# Patient Record
Sex: Female | Born: 1990 | Race: White | Hispanic: No | Marital: Single | Smoking: Former smoker
Health system: Southern US, Community
[De-identification: ages and names within clinical notes are randomized; demographics above are authoritative.]

---
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
## 2009-03-22 HISTORY — PX: MOUTH SURGERY: SHX715

## 2010-08-04 NOTE — Letter (Signed)
May 20, 2008    Frazier Butt, DO  [address]  [address]  [place], [place] [address]   RE:  Leah, Nguyen  MRN:  [id number]  /  DOB:  May 13, 1990   Dear Dr. Margaretha Sheffield:   It was my pleasure to see Leah Nguyen in electrophysiology followup  today.  As you recall, she is a pleasant 20 year old female with  episodic heart racing.  She describes abrupt onset and gradual  termination of heart racing.  Upon previously being evaluated by me in  January 2010, she reported that episodes occurred several times per  month.  We therefore placed a life watch event monitor between April 25, 2008, and May 15, 2008.  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  We therefore only  recorded approximately 8 days of rhythms.  She had no arrhythmias  detected during that time.  The patient denies any symptomatic episodes  of tachycardia since last being seen in our office.  She is otherwise  without complaint today.   PROBLEM LIST:  Palpitations (as above).   CURRENT MEDICATIONS:  1. Septra b.i.d.  2. Birth control shot monthly   PHYSICAL EXAMINATION:  VITAL SIGNS:  Blood pressure 126/56, respirations  18, pulse 76 beats per minute.  GENERAL:  The patient is a well-appearing female in no acute distress.  She is alert and oriented x3.  HEENT:  Normocephalic, atraumatic.  Sclerae are clear.  Conjunctivae are  pink.  Oropharynx is clear.  NECK:  Supple.  No thyromegaly, JVD, or bruits.  LUNGS:  Clear to auscultation bilaterally.  HEART:  Regular rate and rhythm.  No murmurs, rubs, or gallops.  GI:  Soft, nontender, nondistended with positive bowel sounds.  EXTREMITIES:  No clubbing, cyanosis, or edema.  NEUROLOGIC:  Cranial nerves II-XII are intact.  Strength and sensation  are intact.  SKIN:  No ecchymoses or lacerations.   IMPRESSION:  Leah Nguyen is a pleasant  20 year old female with prior  symptomatic heart racing with abrupt onset and gradual termination.  Her symptoms are suggestive of supraventricular tachycardia; however, we  have been unable to document this previously.  A 21-day life watch  monitor failed to document any arrhythmias, though the patient is clear  that she did not have any symptomatic supraventricular tachycardia  during that time.  As her episodes are relatively infrequent, I do not  think that she would benefit from daily medicines, though for episodes  increased in frequency, we could consider diltiazem down the road.  I  think that presently we should continue our strategy of observation, so  I think that we should continue a strategy of observation.  Should she  have increasing frequency and duration of her symptoms then hopefully we  will be able to document her arrhythmia.  Therefore, if episodes  continue to worsen, we could also consider EP study, though it would be  ideal to have tachycardia documented prior to EP study.  I have asked  her to return to clinic in 3 months for further assessment.   PLAN:  1. No medication changes today.  2. Follow up in my office in 3 months.   Thank you for the opportunity of participating in the care of Ms.  Nguyen.  Please feel free to contact me if you wish to discuss her care  further.    Sincerely,  Hillis Range, MD  Electronically Signed    JA/MedQ  DD: 05/20/2008  DT: 05/21/2008  Job #: [id number]

## 2010-08-04 NOTE — Letter (Signed)
April 18, 2008    Marsa Aris, DO  [address]  [place], [place] [address]   RE:  [name], [name]  MRN:  [id number]  /  DOB:  [birth year]   Dear Dr. Margaretha Sheffield:   It was my pleasure to see your patient, Leah Nguyen in  electrophysiology consultation today regarding symptomatic heart  racing.  She is a very pleasant 20 year old female without significant  past medical history except for episodic heart racing.  She reports that  since the 5th grade, she has had episodes of abrupt onset and gradual  termination of tachycardia.  These episodes typically occur with sudden  jerking movements including bending or throwing.  She reports associated  palpitations, weakness, dizziness, and shortness of breath.  She denies  presyncope or syncope.  These episodes typically last between 5 and 30  minutes.  Unfortunately, they have not persisted long enough to have an  EKG obtained.  We have therefore, not previously documented her  tachycardia.  She notes that episodes have increased in frequency and  now occur approximately one time per month.  She has recently tried  vagal maneuvers without success.  She otherwise remains quite active.  She exercises approximately 2 hours per day including heavy weight  lifting and running.  She denies chest pain, presyncope, or syncope.  She reports that she feels fine following episodes of tachycardia  without residual symptoms.  She is otherwise without complaint today.   PAST MEDICAL HISTORY:  Symptomatic heart racing likely secondary to  supraventricular tachycardia, though this has not been documented.   PAST SURGICAL HISTORY:  None.   ALLERGIES:  None.   CURRENT MEDICATIONS:  1. Septra b.i.d. for acne.  2. Birth control shot monthly.   SOCIAL HISTORY:  The patient lives in [place].  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  She has interest in nursing.  She plays softball for [employer].   She denies tobacco, alcohol,  or drug use.   FAMILY HISTORY:  She denies any family history of cardiomyopathy, sudden  cardiac death, or arrhythmia.   REVIEW OF SYSTEMS:  All systems were reviewed and negative except as  outlined in the HPI above.   PHYSICAL EXAMINATION:  VITALS:  Blood pressure 110/58, heart rate 60,  respirations 18, weight 189 pounds.  GENERAL:  The patient is a well-appearing female, in no acute distress.  She is alert and oriented x3.  HEENT:  Normocephalic, atraumatic.  Sclerae clear.  Conjunctivae pink.  Oropharynx clear.  NECK:  Supple.  No thyromegaly, JVD, or bruits.  LUNGS:  Clear to auscultation bilaterally.  HEART:  Regular rate and rhythm.  No murmurs, rubs, or gallops.  GI:  Soft, nontender, nondistended.  Positive bowel sounds.  EXTREMITIES:  No clubbing, cyanosis, or edema.  NEUROLOGIC:  Cranial nerves II through XII are intact.  Strength and  sensation are intact.  SKIN:  No ecchymosis or lacerations.  MUSCULOSKELETAL:  No deformity or atrophy.  PSYCH:  Euthymic mood, full affect.   EKG reveals sinus rhythm at 70 beats per minute and is otherwise  unremarkable.  The PR interval measures 140 milliseconds with a QT  interval of 402 milliseconds.   IMPRESSION:  Leah Nguyen is a pleasant 20 year old female without  significant past medical history who presents for further evaluation and  management of symptomatic palpitations.  Her history is consistent with  supraventricular tachycardia that we have not been able to  previously  document this.  I think that it would be important to attempt to  document her arrhythmia.  I would therefore recommend a LifeWatch 21-day  event monitor.  Once we have documented her tachycardia, then we can  consider treatment options including medical therapy or catheter  ablation.  If we are unable to document her tachycardia, then I think  that an echocardiogram would be reasonable to evaluate for structural  heart  disease.   PLAN:  Therapeutic strategies for symptomatic heart racing was  discussed in detail with the patient today including both medicine and  catheter-based therapies.  Risks, benefits, and alternatives to EP study  and radiofrequency ablation for tachycardia was also discussed.  We will  have a LifeWatch monitor placed and then have the patient return to  clinic in 1 month for further discussion.  As she has had longstanding  palpitations without presyncope or syncope, I think that it is  reasonable for her to continue her present activity level without  placing any new restrictions.   Thank you for the opportunity of participating in the care of Ms.  Nguyen.  Please feel free to contact me if you wish to discuss her care  further.    Sincerely,      Hillis Range, MD  Electronically Signed    JA/MedQ  DD: 04/18/2008  DT: 04/19/2008  Job #: [id number]

## 2015-02-06 ENCOUNTER — Emergency Department (HOSPITAL_COMMUNITY): Payer: No Typology Code available for payment source

## 2015-02-06 ENCOUNTER — Emergency Department (HOSPITAL_COMMUNITY)
Admission: EM | Admit: 2015-02-06 | Discharge: 2015-02-06 | Disposition: A | Discharge status: Home / Self Care | Payer: No Typology Code available for payment source | Attending: Emergency Medicine | Admitting: Emergency Medicine

## 2015-02-06 ENCOUNTER — Encounter (HOSPITAL_COMMUNITY): Payer: Self-pay

## 2015-02-06 DIAGNOSIS — R059 Cough, unspecified: Secondary | ICD-10-CM

## 2015-02-06 DIAGNOSIS — H9202 Otalgia, left ear: Secondary | ICD-10-CM | POA: Diagnosis present

## 2015-02-06 DIAGNOSIS — H6092 Unspecified otitis externa, left ear: Secondary | ICD-10-CM | POA: Insufficient documentation

## 2015-02-06 DIAGNOSIS — J069 Acute upper respiratory infection, unspecified: Secondary | ICD-10-CM | POA: Insufficient documentation

## 2015-02-06 DIAGNOSIS — R05 Cough: Secondary | ICD-10-CM

## 2015-02-06 DIAGNOSIS — F17200 Nicotine dependence, unspecified, uncomplicated: Secondary | ICD-10-CM | POA: Diagnosis not present

## 2015-02-06 DIAGNOSIS — Z720 Tobacco use: Secondary | ICD-10-CM

## 2015-02-06 MED ORDER — NEOMYCIN-POLYMYXIN-HC 3.5-10000-1 OT SUSP: 4.0000 [drp] | Freq: Three times a day (TID) | OTIC | Status: DC

## 2015-02-06 NOTE — ED Notes (Signed)
PT st's she has had green drainage from left ear for approx 1 month but does not hurt.  St's at times ear feels full and tender to touch
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
## 2015-02-06 NOTE — ED Notes (Signed)
Pt here for left ear pain, onset one month ago with green drainage from ear and she reports sometimes its hard to hear out of the affected ear.  Pt came in tonight because "I'm tired of messing with it." Pt also reports cough of light green phlegm. Denies sore throat.

## 2015-02-06 NOTE — Discharge Instructions (Signed)
Use ear drops as instructed. DO NOT put anything else into your ear. Continue to stay well-hydrated. Continue to alternate between Tylenol and Ibuprofen for pain or fever. Use Mucinex for cough suppression/expectoration of mucus. Use netipot and flonase to help with nasal congestion. May consider over-the-counter Benadryl or other antihistamine to decrease secretions and for watery itchy eyes. STOP SMOKING! Followup with your primary care doctor in 5-7 days for recheck of ongoing symptoms. Return to emergency department for emergent changing or worsening of symptoms.   Otitis Externa Otitis externa is a germ infection in the outer ear. The outer ear is the area from the eardrum to the outside of the ear. Otitis externa is sometimes called "swimmer's ear." HOME CARE  Put drops in the ear as told by your doctor.  Only take medicine as told by your doctor.  If you have diabetes, your doctor may give you more directions. Follow your doctor's directions.  Keep all doctor visits as told. To avoid another infection:  Keep your ear dry. Use the corner of a towel to dry your ear after swimming or bathing.  Avoid scratching or putting things inside your ear.  Avoid swimming in lakes, dirty water, or pools that use a chemical called chlorine poorly.  You may use ear drops after swimming. Combine equal amounts of white vinegar and alcohol in a bottle. Put 3 or 4 drops in each ear. GET HELP IF:   You have a fever.  Your ear is still red, puffy (swollen), or painful after 3 days.  You still have yellowish-white fluid (pus) coming from the ear after 3 days.  Your redness, puffiness, or pain gets worse.  You have a really bad headache.  You have redness, puffiness, pain, or tenderness behind your ear. MAKE SURE YOU:   Understand these instructions.  Will watch your condition.  Will get help right away if you are not doing well or get worse.   This information is not intended to replace  advice given to you by your health care provider. Make sure you discuss any questions you have with your health care provider.   Document Released: 08/25/2007 Document Revised: 03/29/2014 Document Reviewed: 03/25/2011 Elsevier Interactive Patient Education 2016 Elsevier Inc.  Cough, Adult A cough helps to clear your throat and lungs. A cough may last only 2-3 weeks (acute), or it may last longer than 8 weeks (chronic). Many different things can cause a cough. A cough may be a sign of an illness or another medical condition. HOME CARE  Pay attention to any changes in your cough.  Take medicines only as told by your doctor.  If you were prescribed an antibiotic medicine, take it as told by your doctor. Do not stop taking it even if you start to feel better.  Talk with your doctor before you try using a cough medicine.  Drink enough fluid to keep your pee (urine) clear or pale yellow.  If the air is dry, use a cold steam vaporizer or humidifier in your home.  Stay away from things that make you cough at work or at home.  If your cough is worse at night, try using extra pillows to raise your head up higher while you sleep.  Do not smoke, and try not to be around smoke. If you need help quitting, ask your doctor.  Do not have caffeine.  Do not drink alcohol.  Rest as needed. GET HELP IF:  You have new problems (symptoms).  You cough up  yellow fluid (pus).  Your cough does not get better after 2-3 weeks, or your cough gets worse.  Medicine does not help your cough and you are not sleeping well.  You have pain that gets worse or pain that is not helped with medicine.  You have a fever.  You are losing weight and you do not know why.  You have night sweats. GET HELP RIGHT AWAY IF:  You cough up blood.  You have trouble breathing.  Your heartbeat is very fast.   This information is not intended to replace advice given to you by your health care provider. Make sure you  discuss any questions you have with your health care provider.   Document Released: 11/19/2010 Document Revised: 11/27/2014 Document Reviewed: 05/15/2014 Elsevier Interactive Patient Education [employer] Inc.  Allergies An allergy is when your body reacts to a substance in a way that is not normal. An allergic reaction can happen after you:  Eat something.  Breathe in something.  Touch something. WHAT KINDS OF ALLERGIES ARE THERE? You can be allergic to:  Things that are only around during certain seasons, like molds and pollens.  Foods.  Drugs.  Insects.  Animal dander. WHAT ARE SYMPTOMS OF ALLERGIES?  Puffiness (swelling). This may happen on the lips, face, tongue, mouth, or throat.  Sneezing.  Coughing.  Breathing loudly (wheezing).  Stuffy nose.  Tingling in the mouth.  A rash.  Itching.  Itchy, red, puffy areas of skin (hives).  Watery eyes.  Throwing up (vomiting).  Watery poop (diarrhea).  Dizziness.  Feeling faint or fainting.  Trouble breathing or swallowing.  A tight feeling in the chest.  A fast heartbeat. HOW ARE ALLERGIES DIAGNOSED? Allergies can be diagnosed with:  A medical and family history.  Skin tests.  Blood tests.  A food diary. A food diary is a record of all the foods, drinks, and symptoms you have each day.  The results of an elimination diet. This diet involves making sure not to eat certain foods and then seeing what happens when you start eating them again. HOW ARE ALLERGIES TREATED? There is no cure for allergies, but allergic reactions can be treated with medicine. Severe reactions usually need to be treated at a hospital.  HOW CAN REACTIONS BE PREVENTED? The best way to prevent an allergic reaction is to avoid the thing you are allergic to. Allergy shots and medicines can also help prevent reactions in some cases.   This information is not intended to replace advice given to you by your health care  provider. Make sure you discuss any questions you have with your health care provider.   Document Released: 07/03/2012 Document Revised: 03/29/2014 Document Reviewed: 12/18/2013 Elsevier Interactive Patient Education 2016 Elsevier Inc.  Viral Infections A viral infection can be caused by different types of viruses.Most viral infections are not serious and resolve on their own. However, some infections may cause severe symptoms and may lead to further complications. SYMPTOMS Viruses can frequently cause:  Minor sore throat.  Aches and pains.  Headaches.  Runny nose.  Different types of rashes.  Watery eyes.  Tiredness.  Cough.  Loss of appetite.  Gastrointestinal infections, resulting in nausea, vomiting, and diarrhea. These symptoms do not respond to antibiotics because the infection is not caused by bacteria. However, you might catch a bacterial infection following the viral infection. This is sometimes called a "superinfection." Symptoms of such a bacterial infection may include:  Worsening sore throat with pus and difficulty  swallowing.  Swollen neck glands.  Chills and a high or persistent fever.  Severe headache.  Tenderness over the sinuses.  Persistent overall ill feeling (malaise), muscle aches, and tiredness (fatigue).  Persistent cough.  Yellow, green, or brown mucus production with coughing. HOME CARE INSTRUCTIONS   Only take over-the-counter or prescription medicines for pain, discomfort, diarrhea, or fever as directed by your caregiver.  Drink enough water and fluids to keep your urine clear or pale yellow. Sports drinks can provide valuable electrolytes, sugars, and hydration.  Get plenty of rest and maintain proper nutrition. Soups and broths with crackers or rice are fine. SEEK IMMEDIATE MEDICAL CARE IF:   You have severe headaches, shortness of breath, chest pain, neck pain, or an unusual rash.  You have uncontrolled vomiting, diarrhea, or  you are unable to keep down fluids.  You or your child has an oral temperature above 102 F (38.9 C), not controlled by medicine.  Your baby is older than 3 months with a rectal temperature of 102 F (38.9 C) or higher.  Your baby is 64 months old or younger with a rectal temperature of 100.4 F (38 C) or higher. MAKE SURE YOU:   Understand these instructions.  Will watch your condition.  Will get help right away if you are not doing well or get worse.   This information is not intended to replace advice given to you by your health care provider. Make sure you discuss any questions you have with your health care provider.   Document Released: 12/16/2004 Document Revised: 05/31/2011 Document Reviewed: 08/14/2014 Elsevier Interactive Patient Education 2016 [employer].  Smoking Cessation, Tips for Success If you are ready to quit smoking, congratulations! You have chosen to help yourself be healthier. Cigarettes bring nicotine, tar, carbon monoxide, and other irritants into your body. Your lungs, heart, and blood vessels will be able to work better without these poisons. There are many different ways to quit smoking. Nicotine gum, nicotine patches, a nicotine inhaler, or nicotine nasal spray can help with physical craving. Hypnosis, support groups, and medicines help break the habit of smoking. WHAT THINGS CAN I DO TO MAKE QUITTING EASIER?  Here are some tips to help you quit for good:  Pick a date when you will quit smoking completely. Tell all of your friends and family about your plan to quit on that date.  Do not try to slowly cut down on the number of cigarettes you are smoking. Pick a quit date and quit smoking completely starting on that day.  Throw away all cigarettes.   Clean and remove all ashtrays from your home, work, and car.  On a card, write down your reasons for quitting. Carry the card with you and read it when you get the urge to smoke.  Cleanse your body of  nicotine. Drink enough water and fluids to keep your urine clear or pale yellow. Do this after quitting to flush the nicotine from your body.  Learn to predict your moods. Do not let a bad situation be your excuse to have a cigarette. Some situations in your life might tempt you into wanting a cigarette.  Never have "just one" cigarette. It leads to wanting another and another. Remind yourself of your decision to quit.  Change habits associated with smoking. If you smoked while driving or when feeling stressed, try other activities to replace smoking. Stand up when drinking your coffee. Brush your teeth after eating. Sit in a different chair when you read  the paper. Avoid alcohol while trying to quit, and try to drink fewer caffeinated beverages. Alcohol and caffeine may urge you to smoke.  Avoid foods and drinks that can trigger a desire to smoke, such as sugary or spicy foods and alcohol.  Ask people who smoke not to smoke around you.  Have something planned to do right after eating or having a cup of coffee. For example, plan to take a walk or exercise.  Try a relaxation exercise to calm you down and decrease your stress. Remember, you may be tense and nervous for the first 2 weeks after you quit, but this will pass.  Find new activities to keep your hands busy. Play with a pen, coin, or rubber band. Doodle or draw things on paper.  Brush your teeth right after eating. This will help cut down on the craving for the taste of tobacco after meals. You can also try mouthwash.   Use oral substitutes in place of cigarettes. Try using lemon drops, carrots, cinnamon sticks, or chewing gum. Keep them handy so they are available when you have the urge to smoke.  When you have the urge to smoke, try deep breathing.  Designate your home as a nonsmoking area.  If you are a heavy smoker, ask your health care provider about a prescription for nicotine chewing gum. It can ease your withdrawal from  nicotine.  Reward yourself. Set aside the cigarette money you save and buy yourself something nice.  Look for support from others. Join a support group or smoking cessation program. Ask someone at home or at work to help you with your plan to quit smoking.  Always ask yourself, "Do I need this cigarette or is this just a reflex?" Tell yourself, "Today, I choose not to smoke," or "I do not want to smoke." You are reminding yourself of your decision to quit.  Do not replace cigarette smoking with electronic cigarettes (commonly called e-cigarettes). The safety of e-cigarettes is unknown, and some may contain harmful chemicals.  If you relapse, do not give up! Plan ahead and think about what you will do the next time you get the urge to smoke. HOW WILL I FEEL WHEN I QUIT SMOKING? You may have symptoms of withdrawal because your body is used to nicotine (the addictive substance in cigarettes). You may crave cigarettes, be irritable, feel very hungry, cough often, get headaches, or have difficulty concentrating. The withdrawal symptoms are only temporary. They are strongest when you first quit but will go away within 10-14 days. When withdrawal symptoms occur, stay in control. Think about your reasons for quitting. Remind yourself that these are signs that your body is healing and getting used to being without cigarettes. Remember that withdrawal symptoms are easier to treat than the major diseases that smoking can cause.  Even after the withdrawal is over, expect periodic urges to smoke. However, these cravings are generally short lived and will go away whether you smoke or not. Do not smoke! WHAT RESOURCES ARE AVAILABLE TO HELP ME QUIT SMOKING? Your health care provider can direct you to community resources or hospitals for support, which may include:  Group support.  Education.  Hypnosis.  Therapy.   This information is not intended to replace advice given to you by your health care provider.  Make sure you discuss any questions you have with your health care provider.   Document Released: 12/05/2003 Document Revised: 03/29/2014 Document Reviewed: 08/24/2012 Elsevier Interactive Patient Education [employer]

## 2015-04-14 ENCOUNTER — Emergency Department (HOSPITAL_COMMUNITY)
Admission: EM | Admit: 2015-04-14 | Discharge: 2015-04-14 | Disposition: A | Discharge status: Home / Self Care | Payer: BLUE CROSS/BLUE SHIELD | Attending: Emergency Medicine | Admitting: Emergency Medicine

## 2015-04-14 DIAGNOSIS — Y9389 Activity, other specified: Secondary | ICD-10-CM | POA: Insufficient documentation

## 2015-04-14 DIAGNOSIS — Y998 Other external cause status: Secondary | ICD-10-CM | POA: Insufficient documentation

## 2015-04-14 DIAGNOSIS — Y9289 Other specified places as the place of occurrence of the external cause: Secondary | ICD-10-CM | POA: Diagnosis not present

## 2015-04-14 DIAGNOSIS — S0083XA Contusion of other part of head, initial encounter: Secondary | ICD-10-CM | POA: Diagnosis not present

## 2015-04-14 DIAGNOSIS — S0993XA Unspecified injury of face, initial encounter: Secondary | ICD-10-CM | POA: Diagnosis present

## 2015-04-14 DIAGNOSIS — S00431A Contusion of right ear, initial encounter: Secondary | ICD-10-CM | POA: Insufficient documentation

## 2015-04-14 DIAGNOSIS — F17200 Nicotine dependence, unspecified, uncomplicated: Secondary | ICD-10-CM | POA: Insufficient documentation

## 2015-04-14 NOTE — ED Notes (Signed)
Bed: WA01 Expected date:  Expected time:  Means of arrival:  Comments: 

## 2015-04-14 NOTE — ED Notes (Signed)
Detective at bedside. Will discharge once detective is done.

## 2015-04-14 NOTE — ED Notes (Signed)
CSI at bedside.

## 2015-04-14 NOTE — Discharge Instructions (Signed)
Facial or Scalp Contusion A facial or scalp contusion is a deep bruise on the face or head. Injuries to the face and head generally cause a lot of swelling, especially around the eyes. Contusions are the result of an injury that caused bleeding under the skin. The contusion may turn blue, purple, or yellow. Minor injuries will give you a painless contusion, but more severe contusions may stay painful and swollen for a few weeks.  CAUSES  A facial or scalp contusion is caused by a blunt injury or trauma to the face or head area.  SIGNS AND SYMPTOMS   Swelling of the injured area.   Discoloration of the injured area.   Tenderness, soreness, or pain in the injured area.  DIAGNOSIS  The diagnosis can be made by taking a medical history and doing a physical exam. An X-ray exam, CT scan, or MRI may be needed to determine if there are any associated injuries, such as broken bones (fractures). TREATMENT  Often, the best treatment for a facial or scalp contusion is applying cold compresses to the injured area. Over-the-counter medicines may also be recommended for pain control.  HOME CARE INSTRUCTIONS   Only take over-the-counter or prescription medicines as directed by your health care provider.   Apply ice to the injured area.   Put ice in a plastic bag.   Place a towel between your skin and the bag.   Leave the ice on for 20 minutes, 2-3 times a day.  SEEK MEDICAL CARE IF:  You have bite problems.   You have pain with chewing.   You are concerned about facial defects. SEEK IMMEDIATE MEDICAL CARE IF:  You have severe pain or a headache that is not relieved by medicine.   You have unusual sleepiness, confusion, or personality changes.   You throw up (vomit).   You have a persistent nosebleed.   You have double vision or blurred vision.   You have fluid drainage from your nose or ear.   You have difficulty walking or using your arms or legs.  MAKE SURE YOU:    Understand these instructions.  Will watch your condition.  Will get help right away if you are not doing well or get worse.   This information is not intended to replace advice given to you by your health care provider. Make sure you discuss any questions you have with your health care provider.   Document Released: 04/15/2004 Document Revised: 03/29/2014 Document Reviewed: 10/19/2012 Elsevier Interactive Patient Education 2016 [employer]. Domestic Violence Information WHAT IS DOMESTIC VIOLENCE?  Domestic violence, also called intimate partner violence, can involve physical, emotional, psychological, sexual, and economic abuse by a current or former intimate partner. Stalking is also considered a type of domestic violence. Domestic violence can happen between people who are or were:   Married.  Dating.  Living together. Abusers repeatedly act to maintain control and power over their partner. Physical abuse can include:  Slapping.   Hitting.   Kicking.   Punching.  Choking.  Pulling the victim's hair.  Damaging the victim's property.  Threatening or hurting the victim with weapons.  Trapping the victim in his or her home.  Forcing the victim to use drugs or alcohol. Emotional and psychological abuse can include:  Threats.  Insults.   Isolation.   Humiliation.  Jealousy and possessiveness.  Blame.  Withholding affection.   Intimidation.   Manipulation.   Limiting contact with friends and family.  Sexual abuse can include:  Forcing sex.   Forcing sexual touching.   Hurting the victim during sex.  Forcing the victim to have sex with other people.  Giving the victim a sexually transmitted disease (STD) on purpose. Economic abuse can include:  Controlling resources, such as money, food, transportation, a phone, or computer.  Stealing money from the victim or his or her family or friends.  Forbidding the victim to  work.  Refusing to work or to contribute to the household. Stalking can include:  Making repeated, unwanted phone calls, emails, or text messages.  Leaving cards, letters, flowers or other items the victim does not want.  Watching or following the victim from a distance.  Going to places where the victim does not want the abuser.  Entering the victim's home or car.  Damaging the victim's personal property. WHAT ARE SOME WARNING SIGNS OF DOMESTIC VIOLENCE?  Physical signs  Bruises.   Broken bones.   Burns or cuts.   Physical pain.   Head injury. Emotional and psychological signs  Crying.   Depression.   Hopelessness.   Desperation.   Trouble sleeping.   Fear of partner.   Anxiety.  Suicidal behavior.  Antisocial behavior.  Low self-esteem.  Fear of intimacy.  Flashbacks. Sexual signs  Bruising, swelling, or bleeding of the genital or rectal area.   Signs of a sexually transmitted infection, such as genital sores, warts, or discharge coming from the genital area.   Pain in the genital area.   Unintended pregnancy.  Problems with pregnancy, including delayed prenatal care and prematurity. Economic signs  Having little money or food.   Homelessness.  Asking for or borrowing money. WHAT ARE COMMON BEHAVIORS OF THOSE AFFECTED BY DOMESTIC VIOLENCE? Those affected by domestic violence may:   Be late to work or other events.   Not show up to places as promised.   Have to let their partner know where they are and who they are with.   Be isolated or kept from seeing friends or family.   Make comments about their partner's temper or behavior.   Make excuses for their partner.   Engage in high-risk sexual behaviors.  Use drugs or alcohol.  Have unhealthy diet-related behaviors. WHAT ARE COMMON FEELINGS OF THOSE AFFECTED BY DOMESTIC VIOLENCE?  Those affected by domestic violence may feel that:   They have to be  careful not to say or do things that trigger their partner's anger.   They cannot do anything right.   They deserve to be treated badly.   They are overreacting to their partner's behavior or temper.   They cannot trust their own feelings.   They cannot trust other people.   They are trapped.   Their partner would take away their children.   They are emotionally drained or numb.   Their life is in danger.   They might have to kill their partner to survive.  WHERE CAN YOU GET HELP?  Domestic violence hotlines and websites If you do not feel safe searching for help online at home, use a computer at [employer] to access [employer]. Call 911 if you are in immediate danger or need medical help.   [employer].  The 24-hour phone hotline is (316)324-5991 or 980-210-6418 (TTY).   The videophone is available Monday through Friday, 9 a.m. to 5 p.m. Call (520) 091-8542.  The website is http://thehotline.org  The National Sexual Assault Hotline.  The 24-hour phone hotline is (667)008-5897.  You can access  the online hotline at [employer] Shelters for victims of domestic violence  If you are a victim of domestic violence, there are resources to help you find a temporary place for you and your children to live (shelter). The specific address of these shelters is often not known to the public.  Police Report assaults, threats, and stalking to the police.  Counselors and counseling centers People who have been victims of domestic violence can benefit from counseling. Counseling can help you cope with difficult emotions and empower you to plan for your future safety. The topics you discuss with a counselor are private and confidential. Children of domestic violence victims also might need counseling to manage stress and anxiety.  The court system You can work with a Clinical research associate or an advocate to get legal protection  against an abuser. Protection includes restraining orders and private addresses. Crimes against you, such as assault, can also be prosecuted through the courts. Laws vary by state.   This information is not intended to replace advice given to you by your health care provider. Make sure you discuss any questions you have with your health care provider.   Document Released: 05/29/2003 Document Revised: 03/29/2014 Document Reviewed: 11/23/2013 Elsevier Interactive Patient Education [employer]
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
## 2015-04-14 NOTE — ED Notes (Signed)
[employer] at bedside with patient, will triage pt after.

## 2015-04-16 ENCOUNTER — Emergency Department (HOSPITAL_COMMUNITY)
Admission: EM | Admit: 2015-04-16 | Discharge: 2015-04-17 | Disposition: A | Discharge status: Home / Self Care | Payer: BLUE CROSS/BLUE SHIELD | Attending: Emergency Medicine | Admitting: Emergency Medicine

## 2015-04-16 ENCOUNTER — Encounter (HOSPITAL_COMMUNITY): Payer: Self-pay | Admitting: Emergency Medicine

## 2015-04-16 DIAGNOSIS — S0990XD Unspecified injury of head, subsequent encounter: Secondary | ICD-10-CM | POA: Diagnosis present

## 2015-04-16 DIAGNOSIS — F17200 Nicotine dependence, unspecified, uncomplicated: Secondary | ICD-10-CM | POA: Diagnosis not present

## 2015-04-16 DIAGNOSIS — S060X0D Concussion without loss of consciousness, subsequent encounter: Secondary | ICD-10-CM | POA: Diagnosis not present

## 2015-04-16 DIAGNOSIS — Z79899 Other long term (current) drug therapy: Secondary | ICD-10-CM | POA: Diagnosis not present
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
## 2015-04-16 NOTE — Discharge Instructions (Signed)
Post-Concussion Syndrome Post-concussion syndrome describes the symptoms that can occur after a head injury. These symptoms can last from weeks to months. CAUSES  It is not clear why some head injuries cause post-concussion syndrome. It can occur whether your head injury was mild or severe and whether you were wearing head protection or not.  SIGNS AND SYMPTOMS  Memory difficulties.  Dizziness.  Headaches.  Double vision or blurry vision.  Sensitivity to light.  Hearing difficulties.  Depression.  Tiredness.  Weakness.  Difficulty with concentration.  Difficulty sleeping or staying asleep.  Vomiting.  Poor balance or instability on your feet.  Slow reaction time.  Difficulty learning and remembering things you have heard. DIAGNOSIS  There is no test to determine whether you have post-concussion syndrome. Your health care provider may order an imaging scan of your brain, such as a CT scan, to check for other problems that may be causing your symptoms (such as a severe injury inside your skull). TREATMENT  Usually, these problems disappear over time without medical care. Your health care provider may prescribe medicine to help ease your symptoms. It is important to follow up with a neurologist to evaluate your recovery and address any lingering symptoms or issues. HOME CARE INSTRUCTIONS   Take medicines only as directed by your health care provider. Do not take aspirin. Aspirin can slow blood clotting.  Sleep with your head slightly elevated to help with headaches.  Avoid any situation where there is potential for another head injury. This includes football, hockey, soccer, basketball, martial arts, downhill snow sports, and horseback riding. Your condition will get worse every time you experience a concussion. You should avoid these activities until you are evaluated by the appropriate follow-up health care providers.  Keep all follow-up visits as directed by your health  care provider. This is important. SEEK MEDICAL CARE IF:  You have increased problems paying attention or concentrating.  You have increased difficulty remembering or learning new information.  You need more time to complete tasks or assignments than before.  You have increased irritability or decreased ability to cope with stress.  You have more symptoms than before. Seek medical care if you have any of the following symptoms for more than two weeks after your injury:  Lasting (chronic) headaches.  Dizziness or balance problems.  Nausea.  Vision problems.  Increased sensitivity to noise or light.  Depression or mood swings.  Anxiety or irritability.  Memory problems.  Difficulty concentrating or paying attention.  Sleep problems.  Feeling tired all the time. SEEK IMMEDIATE MEDICAL CARE IF:  You have confusion or unusual drowsiness.  Others find it difficult to wake you up.  You have nausea or persistent, forceful vomiting.  You feel like you are moving when you are not (vertigo). Your eyes may move rapidly back and forth.  You have convulsions or faint.  You have severe, persistent headaches that are not relieved by medicine.  You cannot use your arms or legs normally.  One of your pupils is larger than the other.  You have clear or bloody discharge from your nose or ears.  Your problems are getting worse, not better. MAKE SURE YOU:  Understand these instructions.  Will watch your condition.  Will get help right away if you are not doing well or get worse.   This information is not intended to replace advice given to you by your health care provider. Make sure you discuss any questions you have with your health care provider.  Document Released: 08/28/2001 Document Revised: 03/29/2014 Document Reviewed: 06/13/2013 Elsevier Interactive Patient Education 2016 [employer]. Concussion, Adult A concussion, or closed-head injury, is a brain injury  caused by a direct blow to the head or by a quick and sudden movement (jolt) of the head or neck. Concussions are usually not life-threatening. Even so, the effects of a concussion can be serious. If you have had a concussion before, you are more likely to experience concussion-like symptoms after a direct blow to the head.  CAUSES  Direct blow to the head, such as from running into another player during a soccer game, being hit in a fight, or hitting your head on a hard surface.  A jolt of the head or neck that causes the brain to move back and forth inside the skull, such as in a car crash. SIGNS AND SYMPTOMS The signs of a concussion can be hard to notice. Early on, they may be missed by you, family members, and health care providers. You may look fine but act or feel differently. Symptoms are usually temporary, but they may last for days, weeks, or even longer. Some symptoms may appear right away while others may not show up for hours or days. Every head injury is different. Symptoms include:  Mild to moderate headaches that will not go away.  A feeling of pressure inside your head.  Having more trouble than usual:  Learning or remembering things you have heard.  Answering questions.  Paying attention or concentrating.  Organizing daily tasks.  Making decisions and solving problems.  Slowness in thinking, acting or reacting, speaking, or reading.  Getting lost or being easily confused.  Feeling tired all the time or lacking energy (fatigued).  Feeling drowsy.  Sleep disturbances.  Sleeping more than usual.  Sleeping less than usual.  Trouble falling asleep.  Trouble sleeping (insomnia).  Loss of balance or feeling lightheaded or dizzy.  Nausea or vomiting.  Numbness or tingling.  Increased sensitivity to:  Sounds.  Lights.  Distractions.  Vision problems or eyes that tire easily.  Diminished sense of taste or smell.  Ringing in the ears.  Mood  changes such as feeling sad or anxious.  Becoming easily irritated or angry for little or no reason.  Lack of motivation.  Seeing or hearing things other people do not see or hear (hallucinations). DIAGNOSIS Your health care provider can usually diagnose a concussion based on a description of your injury and symptoms. He or she will ask whether you passed out (lost consciousness) and whether you are having trouble remembering events that happened right before and during your injury. Your evaluation might include:  A brain scan to look for signs of injury to the brain. Even if the test shows no injury, you may still have a concussion.  Blood tests to be sure other problems are not present. TREATMENT  Concussions are usually treated in an emergency department, in urgent care, or at a clinic. You may need to stay in the hospital overnight for further treatment.  Tell your health care provider if you are taking any medicines, including prescription medicines, over-the-counter medicines, and natural remedies. Some medicines, such as blood thinners (anticoagulants) and aspirin, may increase the chance of complications. Also tell your health care provider whether you have had alcohol or are taking illegal drugs. This information may affect treatment.  Your health care provider will send you home with important instructions to follow.  How fast you will recover from a concussion depends on  many factors. These factors include how severe your concussion is, what part of your brain was injured, your age, and how healthy you were before the concussion.  Most people with mild injuries recover fully. Recovery can take time. In general, recovery is slower in older persons. Also, persons who have had a concussion in the past or have other medical problems may find that it takes longer to recover from their current injury. HOME CARE INSTRUCTIONS General Instructions  Carefully follow the directions your  health care provider gave you.  Only take over-the-counter or prescription medicines for pain, discomfort, or fever as directed by your health care provider.  Take only those medicines that your health care provider has approved.  Do not drink alcohol until your health care provider says you are well enough to do so. Alcohol and certain other drugs may slow your recovery and can put you at risk of further injury.  If it is harder than usual to remember things, write them down.  If you are easily distracted, try to do one thing at a time. For example, do not try to watch TV while fixing dinner.  Talk with family members or close friends when making important decisions.  Keep all follow-up appointments. Repeated evaluation of your symptoms is recommended for your recovery.  Watch your symptoms and tell others to do the same. Complications sometimes occur after a concussion. Older adults with a brain injury may have a higher risk of serious complications, such as a blood clot on the brain.  Tell your teachers, school nurse, school counselor, coach, athletic trainer, or work Production designer, theatre/television/film about your injury, symptoms, and restrictions. Tell them about what you can or cannot do. They should watch for:  Increased problems with attention or concentration.  Increased difficulty remembering or learning new information.  Increased time needed to complete tasks or assignments.  Increased irritability or decreased ability to cope with stress.  Increased symptoms.  Rest. Rest helps the brain to heal. Make sure you:  Get plenty of sleep at night. Avoid staying up late at night.  Keep the same bedtime hours on weekends and weekdays.  Rest during the day. Take daytime naps or rest breaks when you feel tired.  Limit activities that require a lot of thought or concentration. These include:  Doing homework or job-related work.  Watching TV.  Working on the computer.  Avoid any situation where  there is potential for another head injury (football, hockey, soccer, basketball, martial arts, downhill snow sports and horseback riding). Your condition will get worse every time you experience a concussion. You should avoid these activities until you are evaluated by the appropriate follow-up health care providers. Returning To Your Regular Activities You will need to return to your normal activities slowly, not all at once. You must give your body and brain enough time for recovery.  Do not return to sports or other athletic activities until your health care provider tells you it is safe to do so.  Ask your health care provider when you can drive, ride a bicycle, or operate heavy machinery. Your ability to react may be slower after a brain injury. Never do these activities if you are dizzy.  Ask your health care provider about when you can return to work or school. Preventing Another Concussion It is very important to avoid another brain injury, especially before you have recovered. In rare cases, another injury can lead to permanent brain damage, brain swelling, or death. The risk of this  is greatest during the first 7-10 days after a head injury. Avoid injuries by:  Wearing a seat belt when riding in a car.  Drinking alcohol only in moderation.  Wearing a helmet when biking, skiing, skateboarding, skating, or doing similar activities.  Avoiding activities that could lead to a second concussion, such as contact or recreational sports, until your health care provider says it is okay.  Taking safety measures in your home.  Remove clutter and tripping hazards from floors and stairways.  Use grab bars in bathrooms and handrails by stairs.  Place non-slip mats on floors and in bathtubs.  Improve lighting in dim areas. SEEK MEDICAL CARE IF:  You have increased problems paying attention or concentrating.  You have increased difficulty remembering or learning new information.  You  need more time to complete tasks or assignments than before.  You have increased irritability or decreased ability to cope with stress.  You have more symptoms than before. Seek medical care if you have any of the following symptoms for more than 2 weeks after your injury:  Lasting (chronic) headaches.  Dizziness or balance problems.  Nausea.  Vision problems.  Increased sensitivity to noise or light.  Depression or mood swings.  Anxiety or irritability.  Memory problems.  Difficulty concentrating or paying attention.  Sleep problems.  Feeling tired all the time. SEEK IMMEDIATE MEDICAL CARE IF:  You have severe or worsening headaches. These may be a sign of a blood clot in the brain.  You have weakness (even if only in one hand, leg, or part of the face).  You have numbness.  You have decreased coordination.  You vomit repeatedly.  You have increased sleepiness.  One pupil is larger than the other.  You have convulsions.  You have slurred speech.  You have increased confusion. This may be a sign of a blood clot in the brain.  You have increased restlessness, agitation, or irritability.  You are unable to recognize people or places.  You have neck pain.  It is difficult to wake you up.  You have unusual behavior changes.  You lose consciousness. MAKE SURE YOU:  Understand these instructions.  Will watch your condition.  Will get help right away if you are not doing well or get worse.   This information is not intended to replace advice given to you by your health care provider. Make sure you discuss any questions you have with your health care provider.   Document Released: 05/29/2003 Document Revised: 03/29/2014 Document Reviewed: 09/28/2012 Elsevier Interactive Patient Education [employer]

## 2016-03-30 ENCOUNTER — Emergency Department (HOSPITAL_BASED_OUTPATIENT_CLINIC_OR_DEPARTMENT_OTHER): Payer: BLUE CROSS/BLUE SHIELD

## 2016-03-30 ENCOUNTER — Emergency Department (HOSPITAL_BASED_OUTPATIENT_CLINIC_OR_DEPARTMENT_OTHER)
Admission: EM | Admit: 2016-03-30 | Discharge: 2016-03-30 | Discharge status: Against Medical Advice | Payer: BLUE CROSS/BLUE SHIELD | Attending: Emergency Medicine | Admitting: Emergency Medicine

## 2016-03-30 ENCOUNTER — Encounter (HOSPITAL_BASED_OUTPATIENT_CLINIC_OR_DEPARTMENT_OTHER): Payer: Self-pay

## 2016-03-30 DIAGNOSIS — Y939 Activity, unspecified: Secondary | ICD-10-CM | POA: Diagnosis not present

## 2016-03-30 DIAGNOSIS — S82851A Displaced trimalleolar fracture of right lower leg, initial encounter for closed fracture: Secondary | ICD-10-CM

## 2016-03-30 DIAGNOSIS — Y92009 Unspecified place in unspecified non-institutional (private) residence as the place of occurrence of the external cause: Secondary | ICD-10-CM | POA: Diagnosis not present

## 2016-03-30 DIAGNOSIS — K529 Noninfective gastroenteritis and colitis, unspecified: Secondary | ICD-10-CM

## 2016-03-30 DIAGNOSIS — W19XXXA Unspecified fall, initial encounter: Secondary | ICD-10-CM | POA: Insufficient documentation

## 2016-03-30 DIAGNOSIS — Y999 Unspecified external cause status: Secondary | ICD-10-CM | POA: Diagnosis not present

## 2016-03-30 DIAGNOSIS — I951 Orthostatic hypotension: Secondary | ICD-10-CM | POA: Diagnosis not present

## 2016-03-30 DIAGNOSIS — Z87891 Personal history of nicotine dependence: Secondary | ICD-10-CM | POA: Insufficient documentation

## 2016-03-30 DIAGNOSIS — S99911A Unspecified injury of right ankle, initial encounter: Secondary | ICD-10-CM | POA: Diagnosis present

## 2016-03-30 DIAGNOSIS — E860 Dehydration: Secondary | ICD-10-CM

## 2016-03-30 LAB — CBC WITH DIFFERENTIAL/PLATELET
BASOS ABS: 0 10*3/uL (ref 0.0–0.1)
Basophils Relative: 0 %
EOS ABS: 0 10*3/uL (ref 0.0–0.7)
EOS PCT: 0 %
HCT: 38.9 % (ref 36.0–46.0)
Hemoglobin: 12.9 g/dL (ref 12.0–15.0)
Lymphocytes Relative: 3 %
Lymphs Abs: 0.5 10*3/uL — ABNORMAL LOW (ref 0.7–4.0)
MCH: 30.3 pg (ref 26.0–34.0)
MCHC: 33.2 g/dL (ref 30.0–36.0)
MCV: 91.3 fL (ref 78.0–100.0)
MONO ABS: 0.4 10*3/uL (ref 0.1–1.0)
Monocytes Relative: 3 %
Neutro Abs: 13.4 10*3/uL — ABNORMAL HIGH (ref 1.7–7.7)
Neutrophils Relative %: 94 %
PLATELETS: 240 10*3/uL (ref 150–400)
RBC: 4.26 MIL/uL (ref 3.87–5.11)
RDW: 11.7 % (ref 11.5–15.5)
WBC: 14.4 10*3/uL — ABNORMAL HIGH (ref 4.0–10.5)

## 2016-03-30 LAB — COMPREHENSIVE METABOLIC PANEL
ALT: 15 U/L (ref 14–54)
AST: 27 U/L (ref 15–41)
Albumin: 4.7 g/dL (ref 3.5–5.0)
Alkaline Phosphatase: 66 U/L (ref 38–126)
Anion gap: 13 (ref 5–15)
BUN: 14 mg/dL (ref 6–20)
CHLORIDE: 105 mmol/L (ref 101–111)
CO2: 19 mmol/L — ABNORMAL LOW (ref 22–32)
CREATININE: 1.14 mg/dL — AB (ref 0.44–1.00)
Calcium: 9.3 mg/dL (ref 8.9–10.3)
GFR calc non Af Amer: >60 mL/min (ref >60)
Glucose, Bld: 187 mg/dL — ABNORMAL HIGH (ref 65–99)
POTASSIUM: 4.2 mmol/L (ref 3.5–5.1)
SODIUM: 137 mmol/L (ref 135–145)
Total Bilirubin: 1 mg/dL (ref 0.3–1.2)
Total Protein: 7.8 g/dL (ref 6.5–8.1)

## 2016-03-30 MED ORDER — FENTANYL CITRATE (PF) 100 MCG/2ML IJ SOLN
INTRAMUSCULAR | Status: DC
Start: 2016-03-30 — End: 2016-03-30
  Filled 2016-03-30: qty 2

## 2016-03-30 MED ORDER — SODIUM CHLORIDE 0.9 % IV BOLUS (SEPSIS)
1000.0000 mL | Freq: Once | INTRAVENOUS | Status: AC
  Administered 2016-03-30: 1000 mL via INTRAVENOUS

## 2016-03-30 MED ORDER — ONDANSETRON HCL 4 MG/2ML IJ SOLN
4.0000 mg | Freq: Once | INTRAMUSCULAR | Status: AC
  Administered 2016-03-30: 4 mg via INTRAVENOUS

## 2016-03-30 MED ORDER — MORPHINE SULFATE 15 MG PO TABS: 15.0000 mg | ORAL_TABLET | ORAL | 0 refills | Status: DC | PRN

## 2016-03-30 MED ORDER — HYDROCODONE-ACETAMINOPHEN 5-325 MG PO TABS
1.0000 | ORAL_TABLET | Freq: Once | ORAL | Status: AC
  Administered 2016-03-30: 1 via ORAL
  Filled 2016-03-30: qty 1

## 2016-03-30 MED ORDER — FENTANYL CITRATE (PF) 100 MCG/2ML IJ SOLN
50.0000 ug | Freq: Once | INTRAMUSCULAR | Status: AC
  Administered 2016-03-30: 50 ug via INTRAVENOUS

## 2016-03-30 MED ORDER — ONDANSETRON 4 MG PO TBDP: ORAL_TABLET | ORAL | 0 refills | Status: AC

## 2016-03-30 MED ORDER — ONDANSETRON HCL 4 MG/2ML IJ SOLN
INTRAMUSCULAR | Status: DC
Start: 2016-03-30 — End: 2016-03-30
  Filled 2016-03-30: qty 2

## 2016-03-30 MED ORDER — FENTANYL CITRATE (PF) 100 MCG/2ML IJ SOLN
INTRAMUSCULAR | Status: AC
  Filled 2016-03-30: qty 2

## 2016-03-30 MED ORDER — OXYCODONE-ACETAMINOPHEN 5-325 MG PO TABS
2.0000 | ORAL_TABLET | Freq: Once | ORAL | Status: AC
Start: 2016-03-30 — End: 2016-03-30
  Administered 2016-03-30: 2 via ORAL
  Filled 2016-03-30: qty 2
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
## 2016-03-30 NOTE — ED Notes (Signed)
Patient transported to CT 

## 2016-03-30 NOTE — ED Triage Notes (Signed)
Pt fasted x 1 day-n/v/d stated last night-pt passed out in BR at her home-pain to right ankle and neck-pt pale-taken to tx room 14 via w/c

## 2016-03-30 NOTE — Discharge Instructions (Signed)
Call ortho for your surgery.

## 2016-03-30 NOTE — ED Notes (Signed)
ED Provider at bedside. 

## 2016-03-30 NOTE — ED Notes (Signed)
Portable x-ray done

## 2016-04-01 ENCOUNTER — Ambulatory Visit (INDEPENDENT_AMBULATORY_CARE_PROVIDER_SITE_OTHER): Payer: BLUE CROSS/BLUE SHIELD | Admitting: Orthopaedic Surgery

## 2016-04-01 ENCOUNTER — Encounter (INDEPENDENT_AMBULATORY_CARE_PROVIDER_SITE_OTHER): Payer: Self-pay | Admitting: Orthopaedic Surgery

## 2016-04-01 DIAGNOSIS — S82851A Displaced trimalleolar fracture of right lower leg, initial encounter for closed fracture: Secondary | ICD-10-CM | POA: Diagnosis not present

## 2016-04-01 MED ORDER — OXYCODONE-ACETAMINOPHEN 5-325 MG PO TABS: 1.0000 | ORAL_TABLET | ORAL | 0 refills | Status: AC | PRN
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
## 2016-04-07 ENCOUNTER — Encounter (HOSPITAL_COMMUNITY): Payer: Self-pay | Admitting: *Deleted

## 2016-04-07 NOTE — Progress Notes (Signed)
Spoke with pt for pre-op call. Pt denies cardiac history. 

## 2016-04-08 ENCOUNTER — Ambulatory Visit (HOSPITAL_COMMUNITY): Payer: BLUE CROSS/BLUE SHIELD | Admitting: Certified Registered Nurse Anesthetist

## 2016-04-08 ENCOUNTER — Ambulatory Visit (HOSPITAL_COMMUNITY): Payer: BLUE CROSS/BLUE SHIELD

## 2016-04-08 ENCOUNTER — Ambulatory Visit (HOSPITAL_COMMUNITY)
Admission: RE | Admit: 2016-04-08 | Discharge: 2016-04-08 | Disposition: A | Discharge status: Home / Self Care | Payer: BLUE CROSS/BLUE SHIELD | Source: Ambulatory Visit | Attending: Orthopaedic Surgery | Admitting: Orthopaedic Surgery

## 2016-04-08 ENCOUNTER — Encounter (HOSPITAL_COMMUNITY): Payer: Self-pay | Admitting: Anesthesiology

## 2016-04-08 ENCOUNTER — Encounter (HOSPITAL_COMMUNITY)
Admission: RE | Disposition: A | Discharge status: Home / Self Care | Payer: Self-pay | Source: Ambulatory Visit | Attending: Orthopaedic Surgery

## 2016-04-08 DIAGNOSIS — Z87891 Personal history of nicotine dependence: Secondary | ICD-10-CM | POA: Insufficient documentation

## 2016-04-08 DIAGNOSIS — S82851A Displaced trimalleolar fracture of right lower leg, initial encounter for closed fracture: Secondary | ICD-10-CM | POA: Insufficient documentation

## 2016-04-08 DIAGNOSIS — Z419 Encounter for procedure for purposes other than remedying health state, unspecified: Secondary | ICD-10-CM

## 2016-04-08 DIAGNOSIS — X58XXXA Exposure to other specified factors, initial encounter: Secondary | ICD-10-CM | POA: Diagnosis not present

## 2016-04-08 HISTORY — PX: ORIF ANKLE FRACTURE: SHX5408

## 2016-04-08 LAB — HCG, SERUM, QUALITATIVE: PREG SERUM: NEGATIVE

## 2016-04-08 SURGERY — OPEN REDUCTION INTERNAL FIXATION (ORIF) ANKLE FRACTURE
Anesthesia: General | Site: Ankle | Laterality: Right

## 2016-04-08 MED ORDER — FENTANYL CITRATE (PF) 100 MCG/2ML IJ SOLN
INTRAMUSCULAR | Status: DC | PRN
  Administered 2016-04-08 (×4): 50 ug via INTRAVENOUS

## 2016-04-08 MED ORDER — MIDAZOLAM HCL 2 MG/2ML IJ SOLN
INTRAMUSCULAR | Status: AC
  Filled 2016-04-08: qty 2

## 2016-04-08 MED ORDER — ONDANSETRON HCL 4 MG PO TABS: 4.0000 mg | ORAL_TABLET | Freq: Three times a day (TID) | ORAL | 0 refills | Status: AC | PRN

## 2016-04-08 MED ORDER — BUPIVACAINE-EPINEPHRINE (PF) 0.5% -1:200000 IJ SOLN
INTRAMUSCULAR | Status: DC | PRN
  Administered 2016-04-08 (×2): 30 mL via PERINEURAL

## 2016-04-08 MED ORDER — ASPIRIN EC 325 MG PO TBEC: 325.0000 mg | DELAYED_RELEASE_TABLET | Freq: Two times a day (BID) | ORAL | 0 refills | Status: AC

## 2016-04-08 MED ORDER — OXYCODONE HCL 5 MG PO TABS
ORAL_TABLET | ORAL | Status: AC
  Filled 2016-04-08: qty 1

## 2016-04-08 MED ORDER — OXYCODONE HCL 5 MG PO TABS: 5.0000 mg | ORAL_TABLET | ORAL | 0 refills | Status: AC | PRN

## 2016-04-08 MED ORDER — MEPERIDINE HCL 25 MG/ML IJ SOLN: 6.2500 mg | INTRAMUSCULAR | Status: DC | PRN

## 2016-04-08 MED ORDER — 0.9 % SODIUM CHLORIDE (POUR BTL) OPTIME
TOPICAL | Status: DC | PRN
  Administered 2016-04-08 (×3): 1000 mL

## 2016-04-08 MED ORDER — OXYCODONE HCL 5 MG PO TABS
5.0000 mg | ORAL_TABLET | Freq: Once | ORAL | Status: AC | PRN
  Administered 2016-04-08: 5 mg via ORAL

## 2016-04-08 MED ORDER — PROMETHAZINE HCL 25 MG PO TABS: 25.0000 mg | ORAL_TABLET | Freq: Four times a day (QID) | ORAL | 1 refills | Status: AC | PRN

## 2016-04-08 MED ORDER — ONDANSETRON HCL 4 MG/2ML IJ SOLN: 4.0000 mg | Freq: Once | INTRAMUSCULAR | Status: DC | PRN

## 2016-04-08 MED ORDER — ACETAMINOPHEN 160 MG/5ML PO SOLN
325.0000 mg | ORAL | Status: DC | PRN
  Filled 2016-04-08: qty 20.3

## 2016-04-08 MED ORDER — PROPOFOL 10 MG/ML IV BOLUS
INTRAVENOUS | Status: DC | PRN
  Administered 2016-04-08: 200 mg via INTRAVENOUS

## 2016-04-08 MED ORDER — OXYCODONE HCL 5 MG PO TABS
ORAL_TABLET | ORAL | Status: AC
  Administered 2016-04-08: 5 mg
  Filled 2016-04-08: qty 1

## 2016-04-08 MED ORDER — KETOROLAC TROMETHAMINE 30 MG/ML IJ SOLN: 30.0000 mg | Freq: Once | INTRAMUSCULAR | Status: DC

## 2016-04-08 MED ORDER — CEFAZOLIN SODIUM 1 G IJ SOLR
INTRAMUSCULAR | Status: DC | PRN
  Administered 2016-04-08: 2 g via INTRAMUSCULAR

## 2016-04-08 MED ORDER — KETOROLAC TROMETHAMINE 30 MG/ML IJ SOLN
INTRAMUSCULAR | Status: DC | PRN
  Administered 2016-04-08: 30 mg via INTRAVENOUS

## 2016-04-08 MED ORDER — MIDAZOLAM HCL 5 MG/5ML IJ SOLN
INTRAMUSCULAR | Status: DC | PRN
  Administered 2016-04-08: 2 mg via INTRAVENOUS

## 2016-04-08 MED ORDER — FENTANYL CITRATE (PF) 100 MCG/2ML IJ SOLN
INTRAMUSCULAR | Status: AC
  Administered 2016-04-08: 100 ug
  Filled 2016-04-08: qty 2

## 2016-04-08 MED ORDER — LIDOCAINE 2% (20 MG/ML) 5 ML SYRINGE
INTRAMUSCULAR | Status: DC | PRN
  Administered 2016-04-08: 100 mg via INTRAVENOUS

## 2016-04-08 MED ORDER — FENTANYL CITRATE (PF) 100 MCG/2ML IJ SOLN
INTRAMUSCULAR | Status: AC
  Filled 2016-04-08: qty 4

## 2016-04-08 MED ORDER — MIDAZOLAM HCL 2 MG/2ML IJ SOLN
INTRAMUSCULAR | Status: AC
  Administered 2016-04-08: 2 mg
  Filled 2016-04-08: qty 2

## 2016-04-08 MED ORDER — SENNOSIDES-DOCUSATE SODIUM 8.6-50 MG PO TABS: 1.0000 | ORAL_TABLET | Freq: Every evening | ORAL | 1 refills | Status: AC | PRN

## 2016-04-08 MED ORDER — OXYCODONE HCL ER 10 MG PO T12A: 10.0000 mg | EXTENDED_RELEASE_TABLET | Freq: Two times a day (BID) | ORAL | 0 refills | Status: AC

## 2016-04-08 MED ORDER — DEXAMETHASONE SODIUM PHOSPHATE 10 MG/ML IJ SOLN
INTRAMUSCULAR | Status: DC | PRN
  Administered 2016-04-08: 10 mg via INTRAVENOUS

## 2016-04-08 MED ORDER — METHOCARBAMOL 750 MG PO TABS: 750.0000 mg | ORAL_TABLET | Freq: Two times a day (BID) | ORAL | 0 refills | Status: AC | PRN

## 2016-04-08 MED ORDER — FENTANYL CITRATE (PF) 100 MCG/2ML IJ SOLN
25.0000 ug | INTRAMUSCULAR | Status: DC | PRN
  Administered 2016-04-08 (×2): 50 ug via INTRAVENOUS

## 2016-04-08 MED ORDER — HYDROMORPHONE HCL 1 MG/ML IJ SOLN
INTRAMUSCULAR | Status: AC
  Filled 2016-04-08: qty 1

## 2016-04-08 MED ORDER — ACETAMINOPHEN 325 MG PO TABS: 325.0000 mg | ORAL_TABLET | ORAL | Status: DC | PRN

## 2016-04-08 MED ORDER — ONDANSETRON HCL 4 MG/2ML IJ SOLN
INTRAMUSCULAR | Status: DC | PRN
  Administered 2016-04-08: 4 mg via INTRAVENOUS

## 2016-04-08 MED ORDER — OXYCODONE HCL 5 MG/5ML PO SOLN: 5.0000 mg | Freq: Once | ORAL | Status: AC | PRN

## 2016-04-08 MED ORDER — HYDROMORPHONE HCL 1 MG/ML IJ SOLN
INTRAMUSCULAR | Status: DC | PRN
  Administered 2016-04-08 (×2): 0.5 mg via INTRAVENOUS

## 2016-04-08 MED ORDER — LACTATED RINGERS IV SOLN
INTRAVENOUS | Status: DC
  Administered 2016-04-08 (×2): via INTRAVENOUS

## 2016-04-08 MED ORDER — DIPHENHYDRAMINE HCL 50 MG/ML IJ SOLN
INTRAMUSCULAR | Status: DC | PRN
  Administered 2016-04-08: 25 mg via INTRAVENOUS

## 2016-04-08 MED ORDER — PROPOFOL 10 MG/ML IV BOLUS
INTRAVENOUS | Status: AC
  Filled 2016-04-08: qty 20

## 2016-04-08 MED ORDER — FENTANYL CITRATE (PF) 100 MCG/2ML IJ SOLN
INTRAMUSCULAR | Status: DC
Start: 2016-04-08 — End: 2016-04-08
  Filled 2016-04-08: qty 2

## 2016-04-08 SURGICAL SUPPLY — 68 items
BANDAGE ACE 4X5 VEL STRL LF (GAUZE/BANDAGES/DRESSINGS) ×3 IMPLANT
BANDAGE ACE 6X5 VEL STRL LF (GAUZE/BANDAGES/DRESSINGS) IMPLANT
BANDAGE ELASTIC 6 VELCRO ST LF (GAUZE/BANDAGES/DRESSINGS) ×3 IMPLANT
BANDAGE ESMARK 6X9 LF (GAUZE/BANDAGES/DRESSINGS) ×1 IMPLANT
BIT DRILL 3.5X122MM AO FIT (BIT) ×3 IMPLANT
BIT DRILL CANN 2.7 (BIT) ×1
BIT DRILL CANN 2.7MM (BIT) ×1
BIT DRILL SRG 2.7XCANN AO CPLG (BIT) ×1 IMPLANT
BIT DRL SRG 2.7XCANN AO CPLNG (BIT) ×1
BLADE SURG 15 STRL LF DISP TIS (BLADE) ×1 IMPLANT
BLADE SURG 15 STRL SS (BLADE) ×2
BNDG COHESIVE 4X5 TAN STRL (GAUZE/BANDAGES/DRESSINGS) ×3 IMPLANT
BNDG ESMARK 6X9 LF (GAUZE/BANDAGES/DRESSINGS) ×3
CANISTER SUCT 3000ML PPV (MISCELLANEOUS) ×3 IMPLANT
COVER SURGICAL LIGHT HANDLE (MISCELLANEOUS) ×3 IMPLANT
CUFF TOURNIQUET SINGLE 34IN LL (TOURNIQUET CUFF) ×3 IMPLANT
CUFF TOURNIQUET SINGLE 44IN (TOURNIQUET CUFF) IMPLANT
DRAPE C-ARM 42X72 X-RAY (DRAPES) ×3 IMPLANT
DRAPE C-ARMOR (DRAPES) ×3 IMPLANT
DRAPE INCISE IOBAN 66X45 STRL (DRAPES) IMPLANT
DRAPE U-SHAPE 47X51 STRL (DRAPES) ×3 IMPLANT
DRILL 2.6X122MM WL AO SHAFT (BIT) ×3 IMPLANT
DURAPREP 26ML APPLICATOR (WOUND CARE) ×3 IMPLANT
ELECT CAUTERY BLADE 6.4 (BLADE) ×3 IMPLANT
ELECT REM PT RETURN 9FT ADLT (ELECTROSURGICAL) ×3
ELECTRODE REM PT RTRN 9FT ADLT (ELECTROSURGICAL) ×1 IMPLANT
GAUZE SPONGE 4X4 12PLY STRL (GAUZE/BANDAGES/DRESSINGS) ×3 IMPLANT
GAUZE XEROFORM 5X9 LF (GAUZE/BANDAGES/DRESSINGS) ×3 IMPLANT
GLOVE SKINSENSE NS SZ7.5 (GLOVE) ×2
GLOVE SKINSENSE STRL SZ7.5 (GLOVE) ×1 IMPLANT
GLOVE SURG SYN 7.5  E (GLOVE) ×6
GLOVE SURG SYN 7.5 E (GLOVE) ×3 IMPLANT
GOWN STRL REIN XL XLG (GOWN DISPOSABLE) ×9 IMPLANT
K-WIRE ORTHOPEDIC 1.4X150L (WIRE) ×6
KIT BASIN OR (CUSTOM PROCEDURE TRAY) ×3 IMPLANT
KIT ROOM TURNOVER OR (KITS) ×3 IMPLANT
KWIRE ORTHOPEDIC 1.4X150L (WIRE) ×2 IMPLANT
NEEDLE HYPO 25GX1X1/2 BEV (NEEDLE) IMPLANT
NS IRRIG 1000ML POUR BTL (IV SOLUTION) ×9 IMPLANT
PACK ORTHO EXTREMITY (CUSTOM PROCEDURE TRAY) ×3 IMPLANT
PAD ABD 8X10 STRL (GAUZE/BANDAGES/DRESSINGS) ×3 IMPLANT
PAD ARMBOARD 7.5X6 YLW CONV (MISCELLANEOUS) ×6 IMPLANT
PADDING CAST COTTON 6X4 STRL (CAST SUPPLIES) ×3 IMPLANT
PADDING CAST SYNTHETIC 4 (CAST SUPPLIES) ×2
PADDING CAST SYNTHETIC 4X4 STR (CAST SUPPLIES) ×1 IMPLANT
PLATE FIBULA 12HOLE STRAIGHT (Plate) ×3 IMPLANT
SCREW BONE 14MMX3.5MM (Screw) ×12 IMPLANT
SCREW BONE 18 (Screw) ×3 IMPLANT
SCREW BONE 3.5X16MM (Screw) ×3 IMPLANT
SCREW CANNULATED 4.0X36MM FT (Screw) ×3 IMPLANT
SCREW CANNULATED 4.0X36MM PT (Screw) ×3 IMPLANT
SCREW LOCKING 3.5X16MM (Screw) ×3 IMPLANT
SCREW LOCKING 3.5X18MM (Screw) ×3 IMPLANT
SPLINT FIBERGLASS 4X30 (CAST SUPPLIES) ×3 IMPLANT
SPONGE GAUZE 4X4 12PLY STER LF (GAUZE/BANDAGES/DRESSINGS) ×3 IMPLANT
SUCTION FRAZIER HANDLE 10FR (MISCELLANEOUS) ×2
SUCTION TUBE FRAZIER 10FR DISP (MISCELLANEOUS) ×1 IMPLANT
SUT ETHILON 3 0 PS 1 (SUTURE) ×15 IMPLANT
SUT VIC AB 0 CT1 27 (SUTURE) ×2
SUT VIC AB 0 CT1 27XBRD ANBCTR (SUTURE) ×1 IMPLANT
SUT VIC AB 2-0 CT1 27 (SUTURE) ×2
SUT VIC AB 2-0 CT1 TAPERPNT 27 (SUTURE) ×1 IMPLANT
SYR CONTROL 10ML LL (SYRINGE) IMPLANT
TOWEL OR 17X24 6PK STRL BLUE (TOWEL DISPOSABLE) ×3 IMPLANT
TOWEL OR 17X26 10 PK STRL BLUE (TOWEL DISPOSABLE) ×3 IMPLANT
TUBE CONNECTING 12'X1/4 (SUCTIONS) ×1
TUBE CONNECTING 12X1/4 (SUCTIONS) ×2 IMPLANT
UNDERPAD 30X30 (UNDERPADS AND DIAPERS) ×3 IMPLANT

## 2016-04-08 NOTE — Anesthesia Postprocedure Evaluation (Addendum)
Anesthesia Post Note  Patient: Leah Nguyen  Procedure(s) Performed: Procedure(s) (LRB): OPEN REDUCTION INTERNAL FIXATION (ORIF) RIGHT TRIMALLEOLAR ANKLE FRACTURE (Right)  Patient location during evaluation: PACU Anesthesia Type: General Level of consciousness: awake Pain management: pain level controlled Vital Signs Assessment: post-procedure vital signs reviewed and stable Respiratory status: spontaneous breathing Cardiovascular status: stable Postop Assessment: no signs of nausea or vomiting Anesthetic complications: no        Last Vitals:  Vitals:   04/08/16 1524 04/08/16 1530  BP: 122/68   Pulse: 94 (!) 103  Resp: 12 17  Temp:      Last Pain:  Vitals:   04/08/16 1517  TempSrc:   PainSc: 3    Pain Goal: Patients Stated Pain Goal: 4 (04/08/16 0953)               HATCHETT JR,JOHN Susann GivensFRANKLIN

## 2016-04-08 NOTE — Discharge Instructions (Signed)
° ° °  1. Keep splint clean and dry °2. Elevate foot above level of the heart °3. Take aspirin to prevent blood clots °4. Take pain meds as needed °5. Strict non weight bearing to operative extremity ° °

## 2016-04-08 NOTE — Anesthesia Procedure Notes (Signed)
Anesthesia Regional Block:  Adductor canal block  Pre-Anesthetic Checklist: ,, timeout performed, Correct Patient, Correct Site, Correct Laterality, Correct Procedure, Correct Position, site marked, Risks and benefits discussed,  Surgical consent,  Pre-op evaluation,  At surgeon's request and post-op pain management  Laterality: Right  Prep: chloraprep       Needles:  Injection technique: Single-shot     Needle Length: 9cm 9 cm Needle Gauge: 21 and 21 G  Needle insertion depth: 5 cm   Additional Needles:  Procedures: ultrasound guided (picture in chart) Adductor canal block Narrative:  Injection made incrementally with aspirations every 5 mL. Anesthesiologist: Leilani AbleHATCHETT, Cece Milhouse

## 2016-04-08 NOTE — Anesthesia Procedure Notes (Signed)
Anesthesia Regional Block:  Popliteal block  Pre-Anesthetic Checklist: ,, timeout performed, Correct Patient, Correct Site, Correct Laterality, Correct Procedure, Correct Position, site marked, Risks and benefits discussed, Surgical consent,  Pre-op evaluation,  Post-op pain management  Laterality: Right  Prep: chloraprep       Needles:  Injection technique: Single-shot  Needle Type: Echogenic Stimulator Needle     Needle Length: 9cm 9 cm Needle Gauge: 21 and 21 G  Needle insertion depth: 4 cm   Additional Needles:  Procedures: ultrasound guided (picture in chart) Popliteal block Narrative:  Start time: 04/08/2016 11:50 AM End time: 04/08/2016 12:00 PM Injection made incrementally with aspirations every 5 mL. Anesthesiologist: Leilani AbleHATCHETT, Dorance Spink

## 2016-04-08 NOTE — Anesthesia Preprocedure Evaluation (Addendum)
Anesthesia Evaluation  Patient identified by MRN, date of birth, ID band Patient awake    Reviewed: Allergy & Precautions, H&P , NPO status , Patient's Chart, lab work & pertinent test results  Airway Mallampati: II  TM Distance: >3 FB Neck ROM: full    Dental no notable dental hx. (+) Teeth Intact   Pulmonary former smoker,    Pulmonary exam normal breath sounds clear to auscultation       Cardiovascular negative cardio ROS Normal cardiovascular exam Rhythm:Regular Rate:Normal     Neuro/Psych negative neurological ROS  negative psych ROS   GI/Hepatic negative GI ROS, Neg liver ROS,   Endo/Other  negative endocrine ROS  Renal/GU negative Renal ROS  negative genitourinary   Musculoskeletal   Abdominal (+) + obese,   Peds  Hematology negative hematology ROS (+)   Anesthesia Other Findings   Reproductive/Obstetrics negative OB ROS                             Anesthesia Physical Anesthesia Plan  ASA: II  Anesthesia Plan: General   Post-op Pain Management:  Regional for Post-op pain   Induction: Intravenous  Airway Management Planned: LMA  Additional Equipment:   Intra-op Plan:   Post-operative Plan:   Informed Consent: I have reviewed the patients History and Physical, chart, labs and discussed the procedure including the risks, benefits and alternatives for the proposed anesthesia with the patient or authorized representative who has indicated his/her understanding and acceptance.     Plan Discussed with: CRNA and Surgeon  Anesthesia Plan Comments:         Anesthesia Quick Evaluation

## 2016-04-08 NOTE — Op Note (Signed)
Date of Surgery: 04/08/2016  INDICATIONS: Leah Nguyen is a 26 y.o.-year-old female who sustained a right ankle fracture; she was indicated for open reduction and internal fixation due to the displaced nature of the articular fracture and came to the operating room today for this procedure. The patient did consent to the procedure after discussion of the risks and benefits.  PREOPERATIVE DIAGNOSIS: right trimalleolar ankle fracture  POSTOPERATIVE DIAGNOSIS: Same.  PROCEDURE: Open treatment of right ankle fracture with internal fixation. Trimalleolar w/o fixation of posterior malleolus CPT 27822.   SURGEON: N. Glee Arvin, M.D.  ASSIST: Randa Lynn, PA student.  ANESTHESIA:  general, regional  TOURNIQUET TIME: less than 90 mins  IV FLUIDS AND URINE: See anesthesia.  ESTIMATED BLOOD LOSS: minimal mL.  IMPLANTS: [employer] Variax 12 hole straight plate  COMPLICATIONS: None.  DESCRIPTION OF PROCEDURE: The patient was brought to the operating room and placed supine on the operating table.  The patient had been signed prior to the procedure and this was documented. The patient had the anesthesia placed by the anesthesiologist.  A nonsterile tourniquet was placed on the upper thigh.  The prep verification and incision time-outs were performed to confirm that this was the correct patient, site, side and location. The patient had an SCD on the opposite lower extremity. The patient did receive antibiotics prior to the incision and was re-dosed during the procedure as needed at indicated intervals.  The patient had the lower extremity prepped and draped in the standard surgical fashion.  The extremity was exsanguinated using an esmarch bandage and the tourniquet was inflated to 300 mm Hg.  A lateral incision was created over the distal fibula. Dissection was taken down to the fascia. The fascia was sharply incised in line with the incision. The peroneal muscles were elevated off of the lateral  aspect of the fibula. Subperiosteal elevation was performed. The fracture was exposed. Immature callus and organized hematoma were removed from the fracture. The fracture was then reduced. There was a large posterior butterfly fragment that was independent from the main fibula. Anatomic reduction was not possible given the characteristic of the fracture. Once I had the overall alignment I placed a interfrag screw from posterior to anterior. The bone clamp was then removed. A 12 hole semitubular plate was placed on the lateral aspect of the fibula at the appropriate position under fluoroscopic guidance. Nonlocking locking screws were placed above and below the fracture using standard AO technique. I then turned my attention to the medial malleolus fracture. A separate incision over the medial malleolus was created. Dissection was taken down to the periosteum. The saphenous vein was identified and retracted posteriorly and protected. The periosteum was then removed the fracture site. The fracture was reduced and clamped. 2 parallel K wires were advanced up through the medial malleolus. Fluoroscopy was used to confirm proper placement. I then placed 2 cannulated 4.0 mm screws over the K wire. One screw was partially threaded and the second was fully threaded. The clamp was then removed. An external rotation stress test was performed and showed no widening of the medial clear space.  Final x-rays were taken. The wounds were thoroughly irrigated and closed in layer fashion using 0 Vicryl, 2-0 Vicryl, 3-0 nylon. Sterile dressings were applied. Foot was immobilized in a short leg splint. Patient tolerated procedure well and no immediate complications.  POSTOPERATIVE PLAN: Ms. Pitstick will remain nonweightbearing on this leg for approximately 6 weeks; Ms. Woodson will return for suture removal in 2  weeks.  He will be immobilized in a short leg splint and then transitioned to a CAM walker at his first follow up  appointment.  Ms. Doristine MangoClement will receive DVT prophylaxis based on other medications, activity level, and risk ratio of bleeding to thrombosis.  Mayra ReelN. Michael Marrah Vanevery, MD [facility] [phone] 2:22 PM

## 2016-04-08 NOTE — Transfer of Care (Signed)
Immediate Anesthesia Transfer of Care Note  Patient: Dellia BeckwithLauren Valley  Procedure(s) Performed: Procedure(s): OPEN REDUCTION INTERNAL FIXATION (ORIF) RIGHT TRIMALLEOLAR ANKLE FRACTURE (Right)  Patient Location: PACU  Anesthesia Type:GA combined with regional for post-op pain  Level of Consciousness: awake, alert  and oriented  Airway & Oxygen Therapy: Patient Spontanous Breathing and Patient connected to nasal cannula oxygen  Post-op Assessment: Report given to RN and Post -op Vital signs reviewed and stable  Post vital signs: Reviewed and stable  Last Vitals:  Vitals:   04/08/16 1205 04/08/16 1417  BP: (!) 132/35   Pulse: 92   Resp: 17   Temp:  36.6 C    Last Pain:  Vitals:   04/08/16 0954  TempSrc: Oral  PainSc:       Patients Stated Pain Goal: 4 (04/08/16 0953)  Complications: No apparent anesthesia complications
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
## 2016-04-08 NOTE — Anesthesia Procedure Notes (Signed)
Procedure Name: LMA Insertion Date/Time: 04/08/2016 12:28 PM Performed by: Samara DeistBECKNER, Ronin Crager B Pre-anesthesia Checklist: Patient identified, Emergency Drugs available, Suction available, Patient being monitored and Timeout performed Patient Re-evaluated:Patient Re-evaluated prior to inductionOxygen Delivery Method: Circle system utilized Preoxygenation: Pre-oxygenation with 100% oxygen Intubation Type: IV induction LMA: LMA inserted LMA Size: 4.0 Number of attempts: 1 Placement Confirmation: positive ETCO2 and breath sounds checked- equal and bilateral Tube secured with: Tape Dental Injury: Teeth and Oropharynx as per pre-operative assessment

## 2016-04-12 ENCOUNTER — Encounter (HOSPITAL_COMMUNITY): Payer: Self-pay | Admitting: Orthopaedic Surgery

## 2016-04-20 ENCOUNTER — Ambulatory Visit (INDEPENDENT_AMBULATORY_CARE_PROVIDER_SITE_OTHER): Payer: Self-pay

## 2016-04-20 ENCOUNTER — Ambulatory Visit (INDEPENDENT_AMBULATORY_CARE_PROVIDER_SITE_OTHER): Payer: BLUE CROSS/BLUE SHIELD | Admitting: Orthopaedic Surgery

## 2016-04-20 ENCOUNTER — Encounter (INDEPENDENT_AMBULATORY_CARE_PROVIDER_SITE_OTHER): Payer: Self-pay | Admitting: Orthopaedic Surgery

## 2016-04-20 DIAGNOSIS — S82851A Displaced trimalleolar fracture of right lower leg, initial encounter for closed fracture: Secondary | ICD-10-CM

## 2016-04-20 NOTE — Progress Notes (Signed)
Patient is 2 weeks status post operative fixation bimalleolar ankle fracture. Her pain is well-controlled. Physical exam shows well-healed incisions with no signs of infection. Mild swelling. X-ray show stable fixation without complication. Sutures were removed today. Nonweightbearing for 4 additional weeks. Cam Walker given. Follow up in 4 weeks with repeat 3 view x-rays of the right ankle.

## 2016-04-26 ENCOUNTER — Telehealth (INDEPENDENT_AMBULATORY_CARE_PROVIDER_SITE_OTHER): Payer: Self-pay | Admitting: Orthopaedic Surgery

## 2016-04-26 NOTE — Telephone Encounter (Signed)
That's fine.  She can be excused.

## 2016-04-26 NOTE — Telephone Encounter (Signed)
Please advise 

## 2016-04-26 NOTE — Telephone Encounter (Signed)
PATIENT CALLED SAYING SHE IS SCHEDULED FOR JURY DUTY SOON AND NEEDS AN EXCUSE SO THAT SHE WONT HAVE TO GO WITH A BROKEN ANKLE. ALSO IS WANTING TO KNOW WHEN SHE WILL BE CLEAR TO GO BACK TO WORK. SHE WANTS TO BE CLEAR TO GO BACK NO EARLIER THAN MARCH 19TH. CB # [phone]

## 2016-04-27 ENCOUNTER — Telehealth (INDEPENDENT_AMBULATORY_CARE_PROVIDER_SITE_OTHER): Payer: Self-pay | Admitting: Orthopaedic Surgery

## 2016-04-27 NOTE — Telephone Encounter (Signed)
Mother is requesting that note for court be faxed ASAP to [phone] attention: EXCUSSEL

## 2016-04-27 NOTE — Telephone Encounter (Signed)
Jury duty not made and is ready for pick up,waiting on second letter to be approved by Dr Roda ShuttersXu.

## 2016-04-27 NOTE — Telephone Encounter (Signed)
Anticipated out of work is 6-12 weeks

## 2016-04-27 NOTE — Telephone Encounter (Signed)
Please advise on other letter first message below. Thanks

## 2016-04-28 NOTE — Telephone Encounter (Signed)
NOTES FAXED

## 2016-04-28 NOTE — Telephone Encounter (Signed)
Pt called back and is aware

## 2016-04-28 NOTE — Telephone Encounter (Signed)
NOTE READY FOR PICK UP AT THE FRONT DESK, LMOM

## 2016-05-18 ENCOUNTER — Ambulatory Visit (INDEPENDENT_AMBULATORY_CARE_PROVIDER_SITE_OTHER): Payer: BLUE CROSS/BLUE SHIELD | Admitting: Orthopaedic Surgery

## 2016-05-18 ENCOUNTER — Ambulatory Visit (INDEPENDENT_AMBULATORY_CARE_PROVIDER_SITE_OTHER): Payer: BLUE CROSS/BLUE SHIELD

## 2016-05-18 DIAGNOSIS — S82851A Displaced trimalleolar fracture of right lower leg, initial encounter for closed fracture: Secondary | ICD-10-CM | POA: Diagnosis not present

## 2016-05-18 NOTE — Progress Notes (Signed)
Vidhi is 6 weeks s/p ORIF right ankle doing well.  Has been NWB.  xrays show stable fixation with good alignment.  No complications.  Surgical scars are well healed.  Will begin WBAT in CAM and wean CAM as tolerated.  PT referral given.  F/u 6 weeks for repeat xrays.

## 2016-06-07 ENCOUNTER — Telehealth (INDEPENDENT_AMBULATORY_CARE_PROVIDER_SITE_OTHER): Payer: Self-pay | Admitting: *Deleted

## 2016-06-07 NOTE — Telephone Encounter (Signed)
Please advise 

## 2016-06-07 NOTE — Telephone Encounter (Signed)
Yes no restrictions.

## 2016-06-07 NOTE — Telephone Encounter (Signed)
Pt calling stating she is ready to go back to work Wednesday 3/21 and is asking if this could be emailed to pts work. [email or web address] and [email or web address]. CB: [phone]

## 2016-06-08 NOTE — Telephone Encounter (Signed)
Emailed

## 2016-06-09 NOTE — Telephone Encounter (Signed)
Emailed to the correct e-mail. Called pt to let her know. I asked her if she had access to the Internet to see if she received it this time and she said she does not have access.when will call us when  she checks email.

## 2016-06-09 NOTE — Telephone Encounter (Signed)
Patient called stating that her boss has not received the email.  Correct email is [email or web address] and also she states that she did not receive her email as well. Her email is [email or web address] (this is her correct email)  [phone].  Thank you.

## 2016-06-28 ENCOUNTER — Ambulatory Visit (INDEPENDENT_AMBULATORY_CARE_PROVIDER_SITE_OTHER): Payer: Self-pay

## 2016-06-28 ENCOUNTER — Ambulatory Visit (INDEPENDENT_AMBULATORY_CARE_PROVIDER_SITE_OTHER): Payer: BLUE CROSS/BLUE SHIELD | Admitting: Orthopaedic Surgery

## 2016-06-28 DIAGNOSIS — S82851A Displaced trimalleolar fracture of right lower leg, initial encounter for closed fracture: Secondary | ICD-10-CM

## 2016-06-28 DIAGNOSIS — M25571 Pain in right ankle and joints of right foot: Secondary | ICD-10-CM | POA: Diagnosis not present

## 2016-06-28 NOTE — Progress Notes (Signed)
Patient is almost 3 months status post ORIF right ankle fracture. She endorses no pain today. She. She has finished physical therapy. She does have residual swelling. Physical exam shows well-healed surgical scars with expected postoperative swelling. She has good range of motion of the ankle and good strength. X-ray show continued evidence of bridging callus and healing. No complications. At this point the increase activity as tolerated. I don't want her to run yet. She may do nonimpact exercises to tolerance. I do want to see her back in 2 months with repeat 3 view x-rays of the right ankle.

## 2016-08-23 NOTE — Addendum Note (Signed)
Addendum  created 08/23/16 1013 by Andrw Mcguirt, MD   Sign clinical note    

## 2016-08-30 ENCOUNTER — Ambulatory Visit (INDEPENDENT_AMBULATORY_CARE_PROVIDER_SITE_OTHER): Payer: BLUE CROSS/BLUE SHIELD | Admitting: Orthopaedic Surgery

## 2016-10-13 IMAGING — DX DG CHEST 2V
2 series · 2 of 2 positions shown · non-contrast
Comparison: None.

CLINICAL DATA: Cough and shortness of breath for 1 month

EXAM:
CHEST - 2 VIEW

[chest pa]
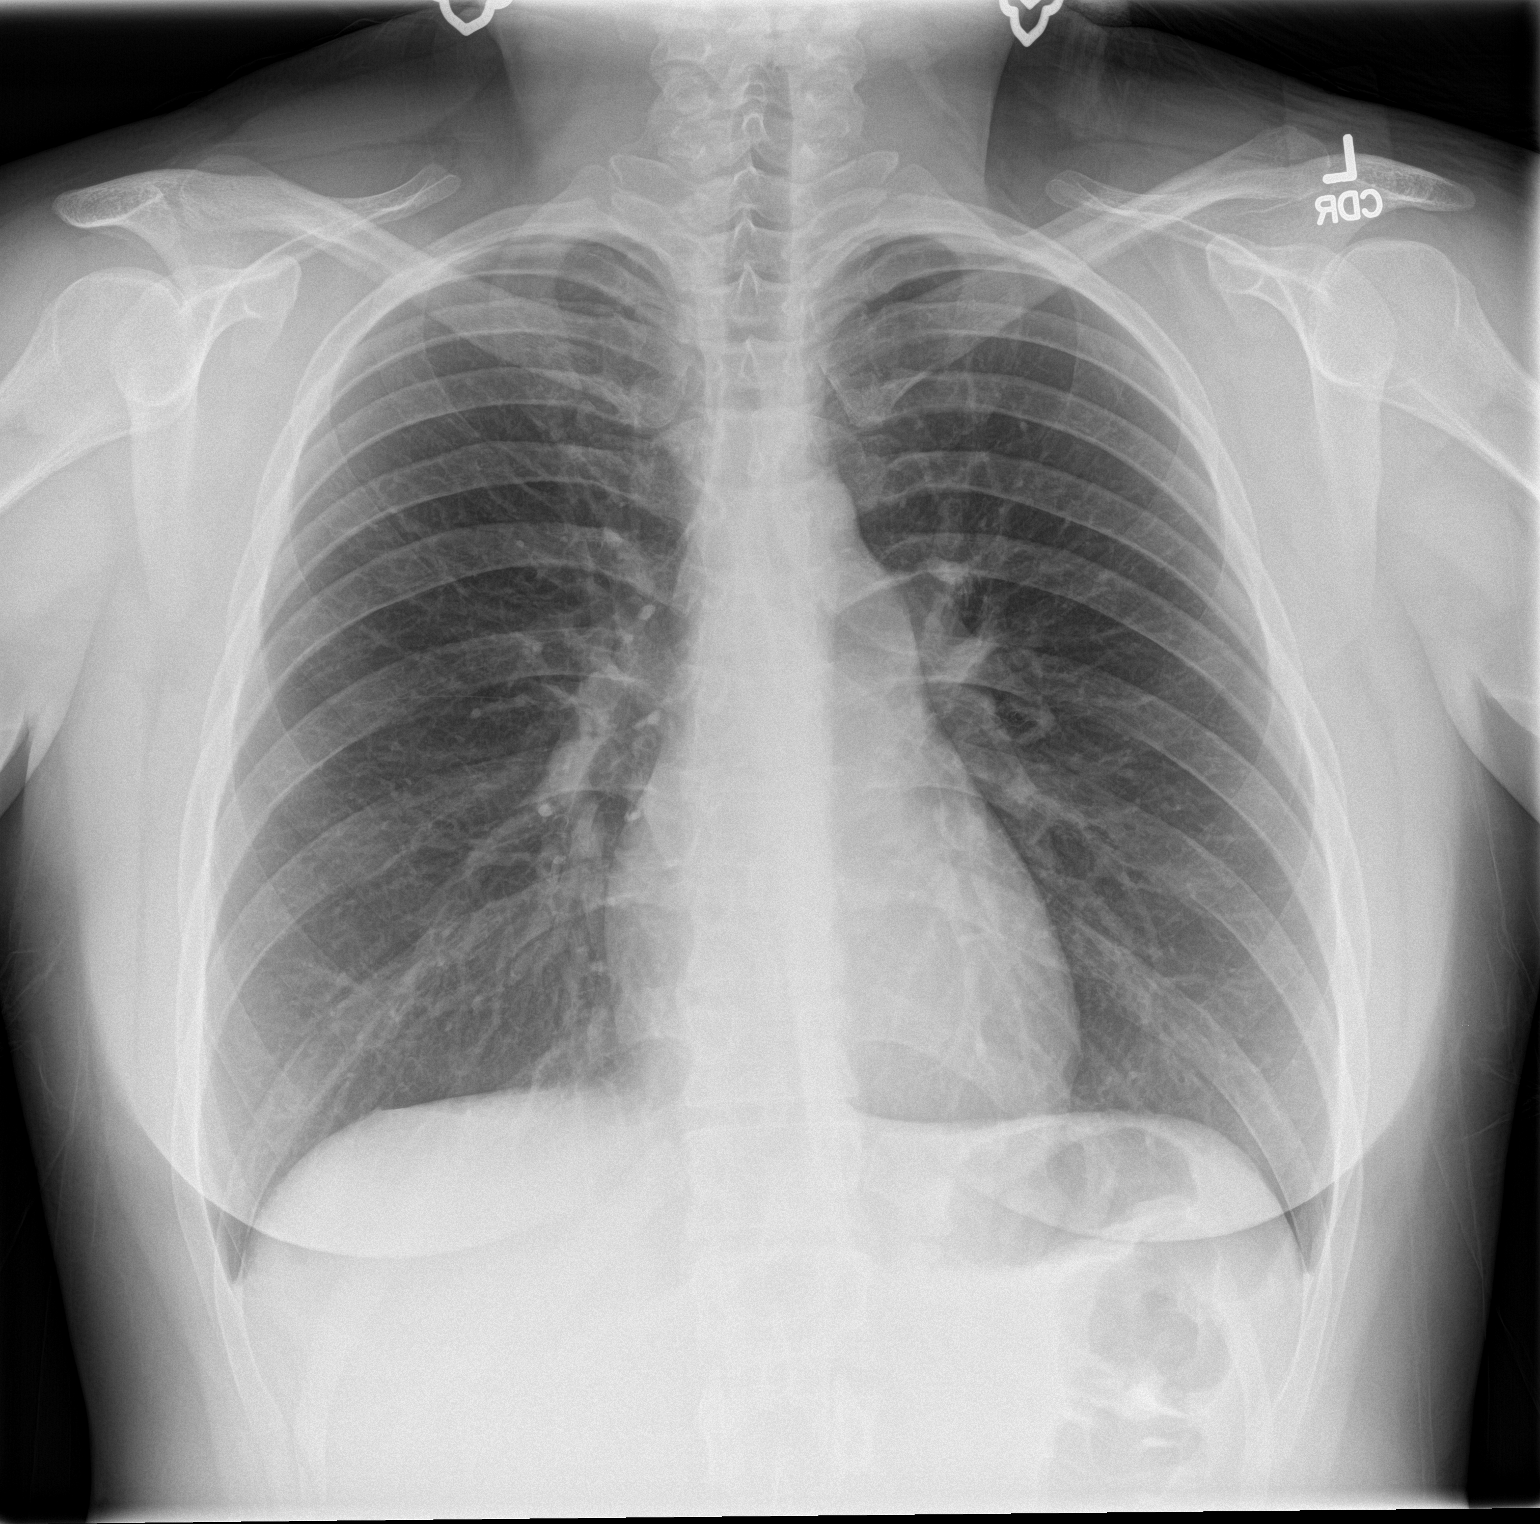

[chest lat]
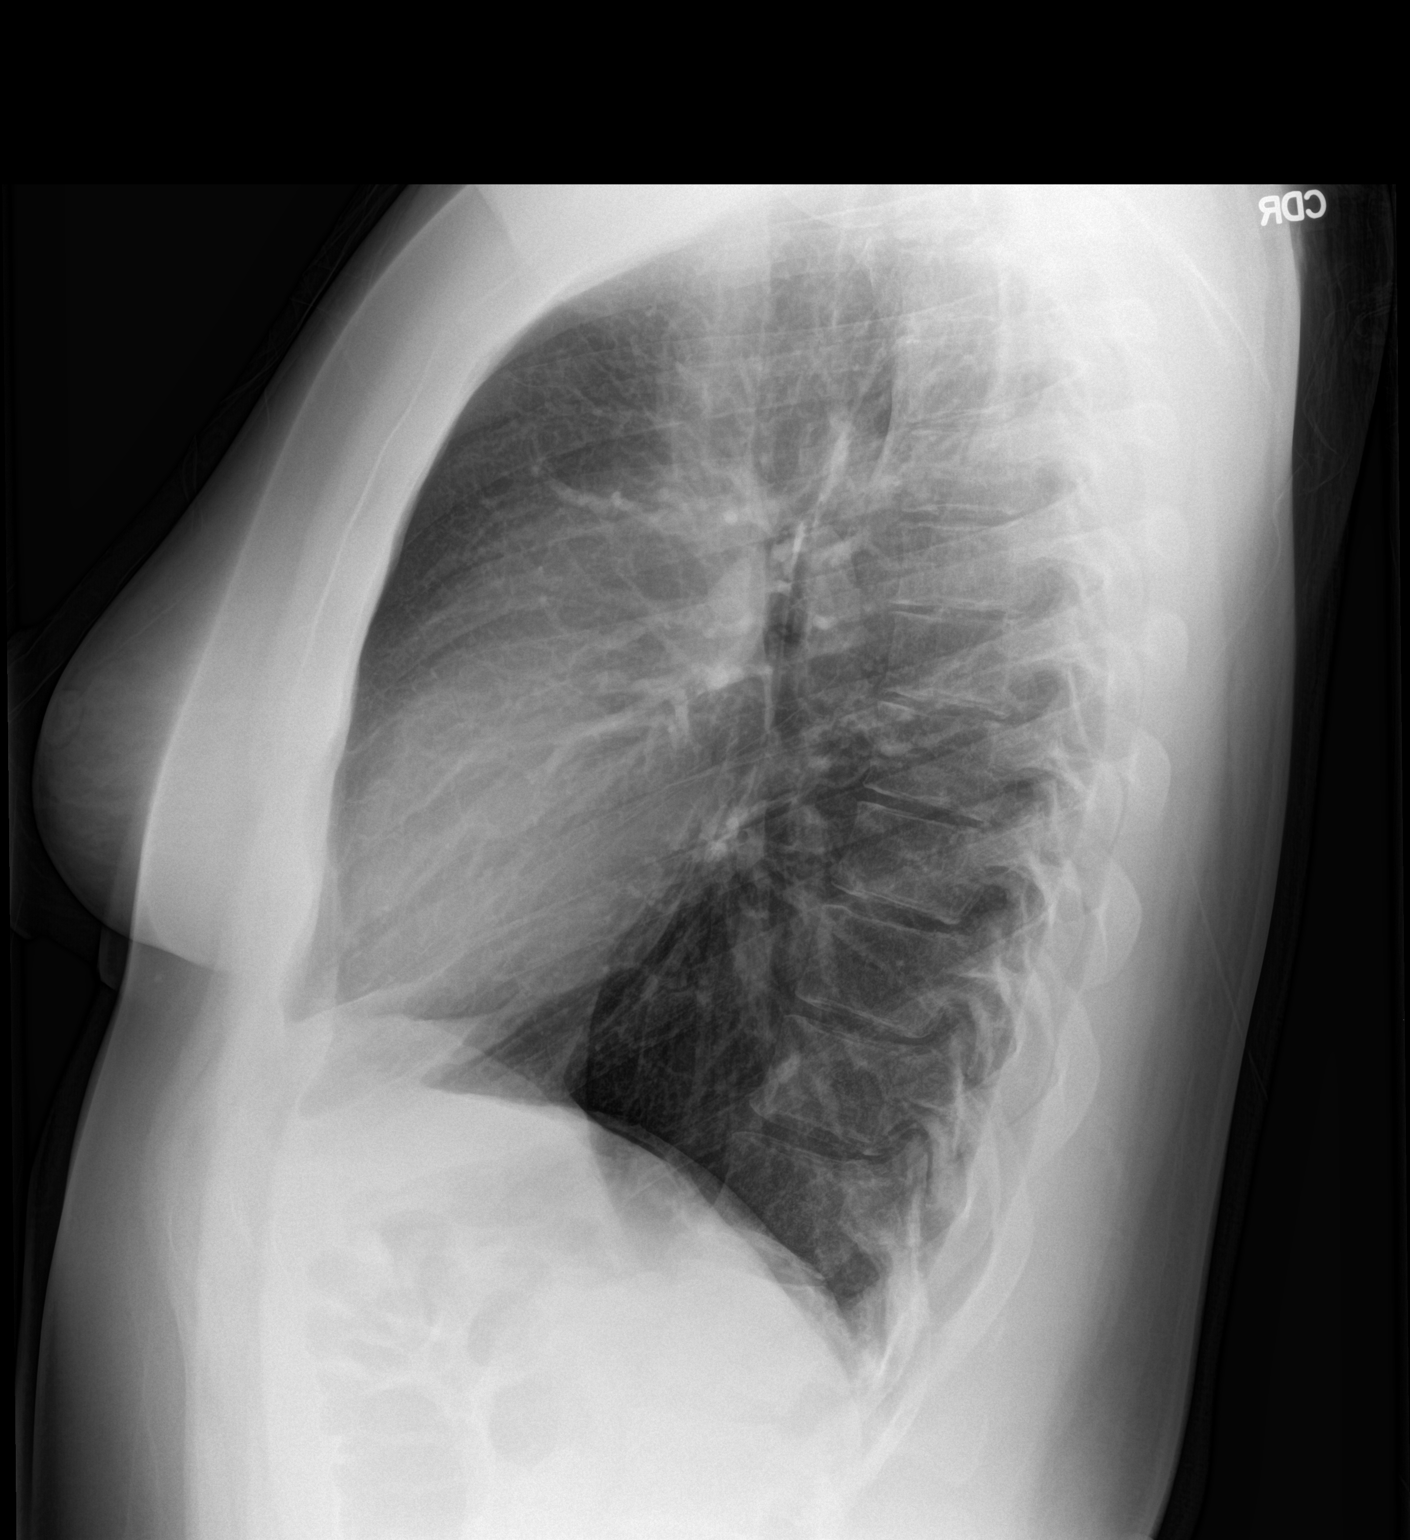

[2 of 2 positions shown; findings below may reference images not displayed]

FINDINGS: The heart size and mediastinal contours are within normal limits.
Both lungs are clear. The visualized skeletal structures are
unremarkable.
IMPRESSION: No active disease.

## 2017-01-18 ENCOUNTER — Ambulatory Visit (INDEPENDENT_AMBULATORY_CARE_PROVIDER_SITE_OTHER): Payer: BLUE CROSS/BLUE SHIELD | Admitting: Orthopaedic Surgery

## 2017-01-18 ENCOUNTER — Ambulatory Visit (INDEPENDENT_AMBULATORY_CARE_PROVIDER_SITE_OTHER): Payer: BLUE CROSS/BLUE SHIELD

## 2017-01-18 ENCOUNTER — Encounter (INDEPENDENT_AMBULATORY_CARE_PROVIDER_SITE_OTHER): Payer: Self-pay | Admitting: Orthopaedic Surgery

## 2017-01-18 DIAGNOSIS — S82851A Displaced trimalleolar fracture of right lower leg, initial encounter for closed fracture: Secondary | ICD-10-CM | POA: Diagnosis not present
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
## 2017-12-05 IMAGING — DX DG ANKLE 2V *R*
2 series · 2 of 2 positions shown · non-contrast
Comparison: None.

CLINICAL DATA: Right ankle injury and pain due to a fall today.
Initial encounter.

EXAM:
RIGHT ANKLE - 2 VIEW

[ankle ap]
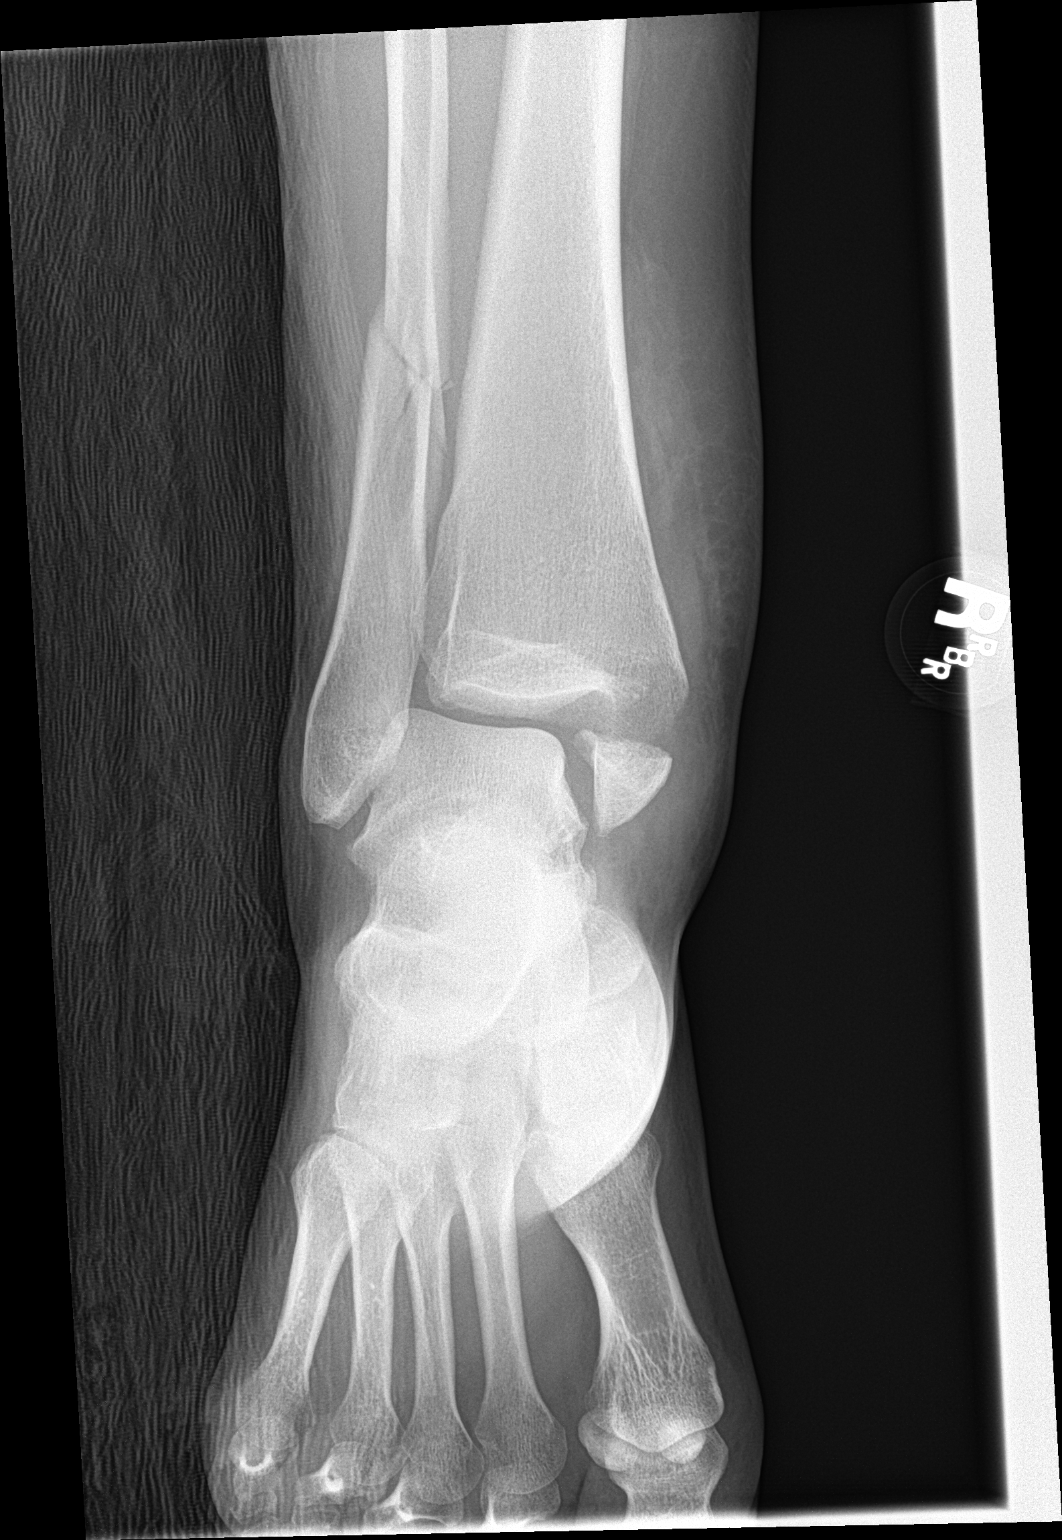

[ankle lat]
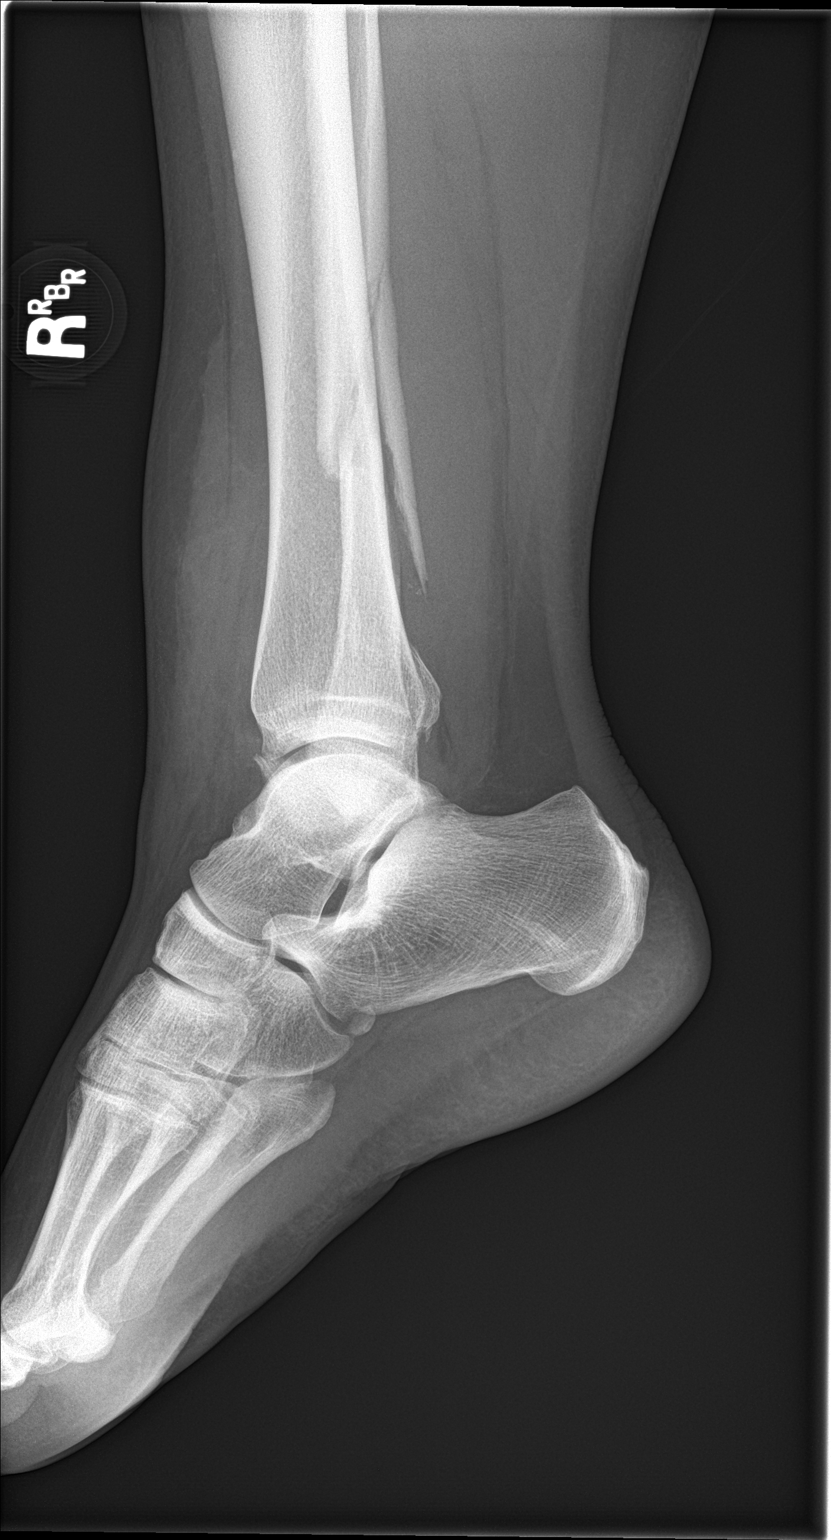

[2 of 2 positions shown; findings below may reference images not displayed]

FINDINGS: The patient has a medial malleolar fracture with distraction of
approximately 1 cm. The patient also has a mildly comminuted
fracture of the distal diaphysis of the fibula. The fracture
originates approximately 14 cm above the tip of the lateral
malleolus and extends 8 cm distally toward the metaphysis. The
fracture shows mild anterior displacement of the distal fragment.
The patient also has a fracture of the posterior malleolus where a
fracture fragment measuring approximately 0.4 cm AP at the plafond
superiorly displaced 1 cm. There is also likely a fracture of the
anterior lip of the tibia. Extensive soft tissue swelling is present
about the ankle.
IMPRESSION: Distal fibular, medial and posterior malleolar and anterior tibial
fractures as described above with associated swelling.

## 2017-12-14 IMAGING — RF DG ANKLE COMPLETE 3+V*R*
1 series · 5 of 5 positions shown · non-contrast
Comparison: 03/30/2016

CLINICAL DATA: ORIF of right ankle fractures.

EXAM:
RIGHT ANKLE - COMPLETE 3+ VIEW; DG C-ARM 61-120 MIN

[Series 1: run · 5 of 5 slices shown]
[im 1/5]
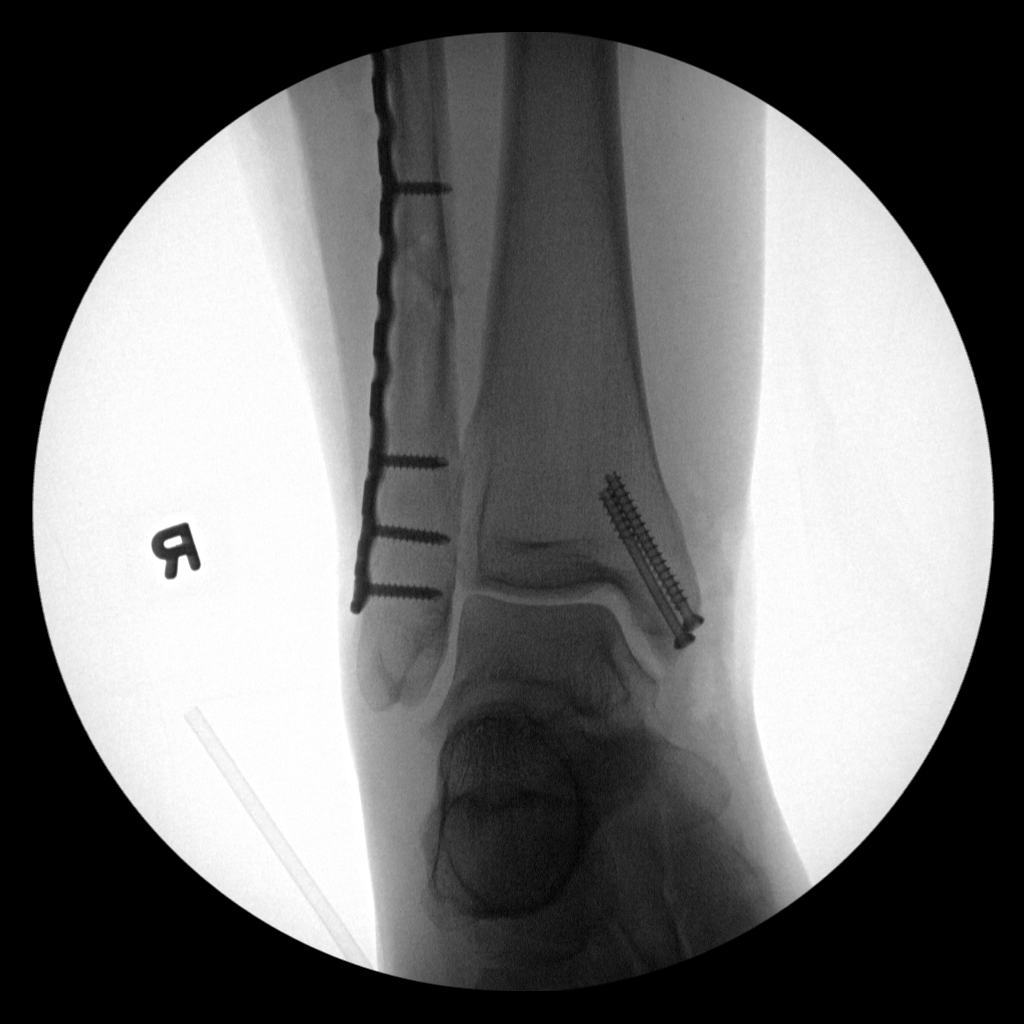
[im 2/5]
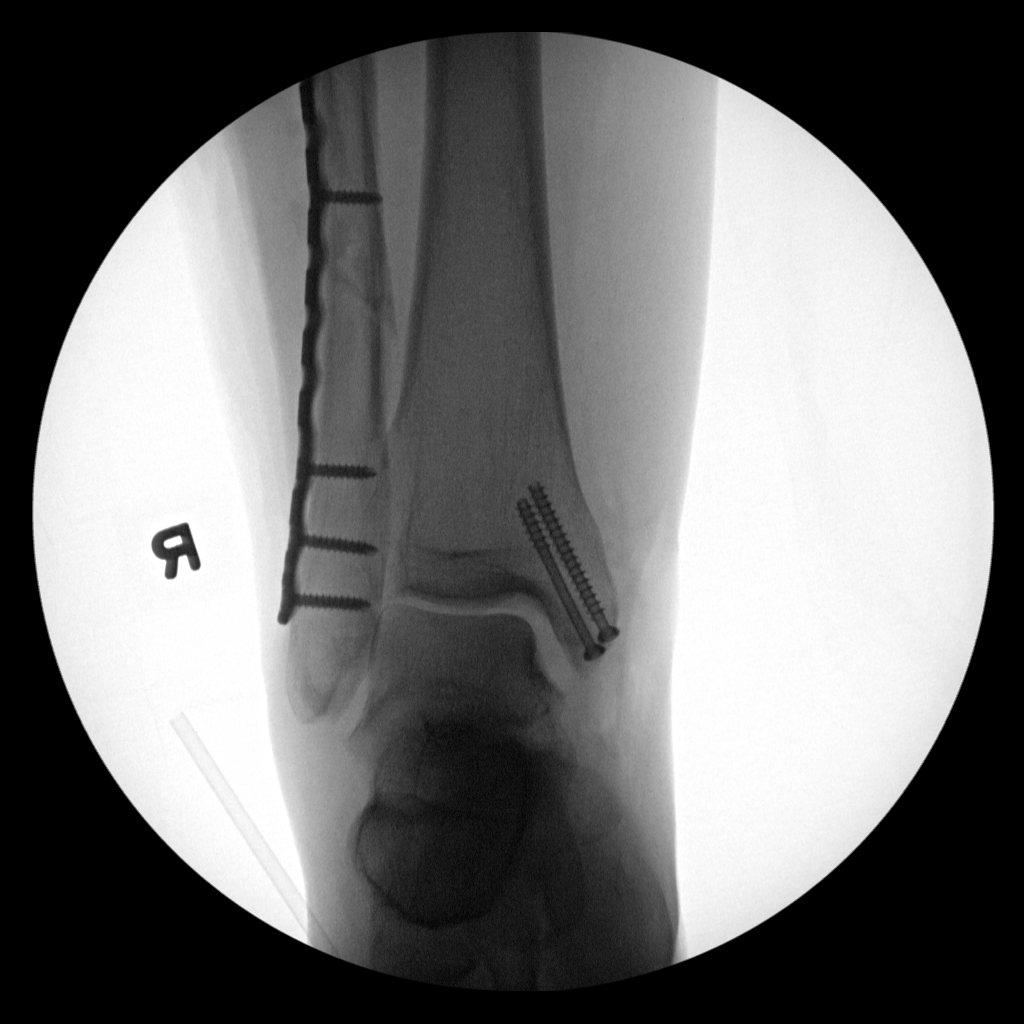
[im 3/5]
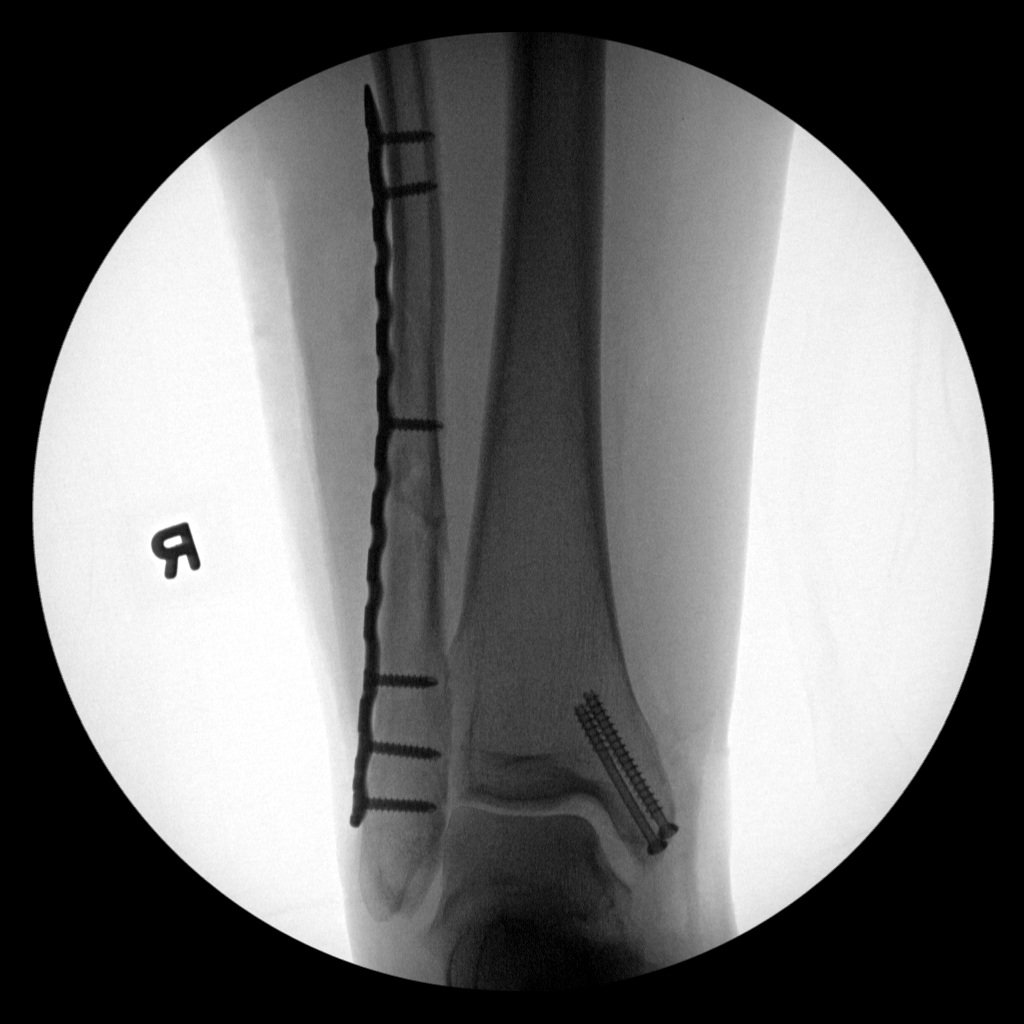
[im 4/5]
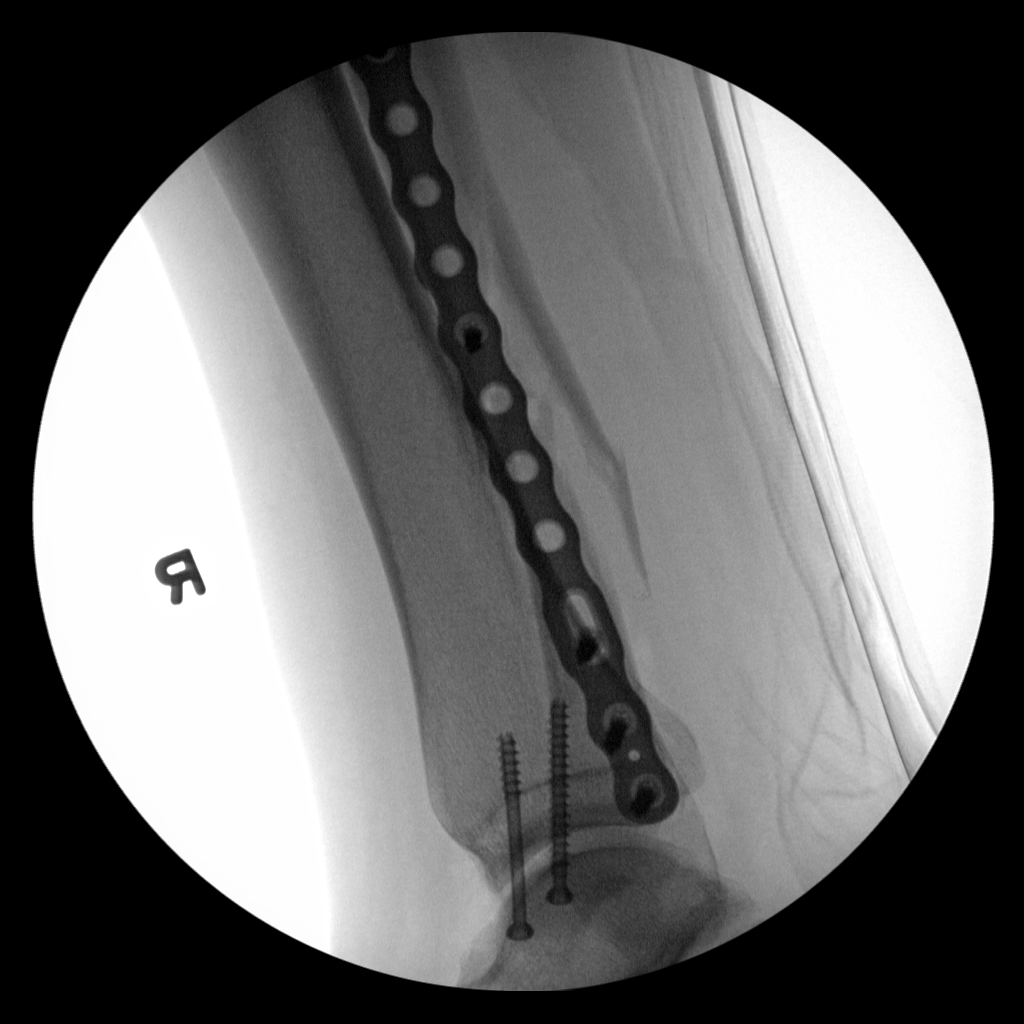
[im 5/5]
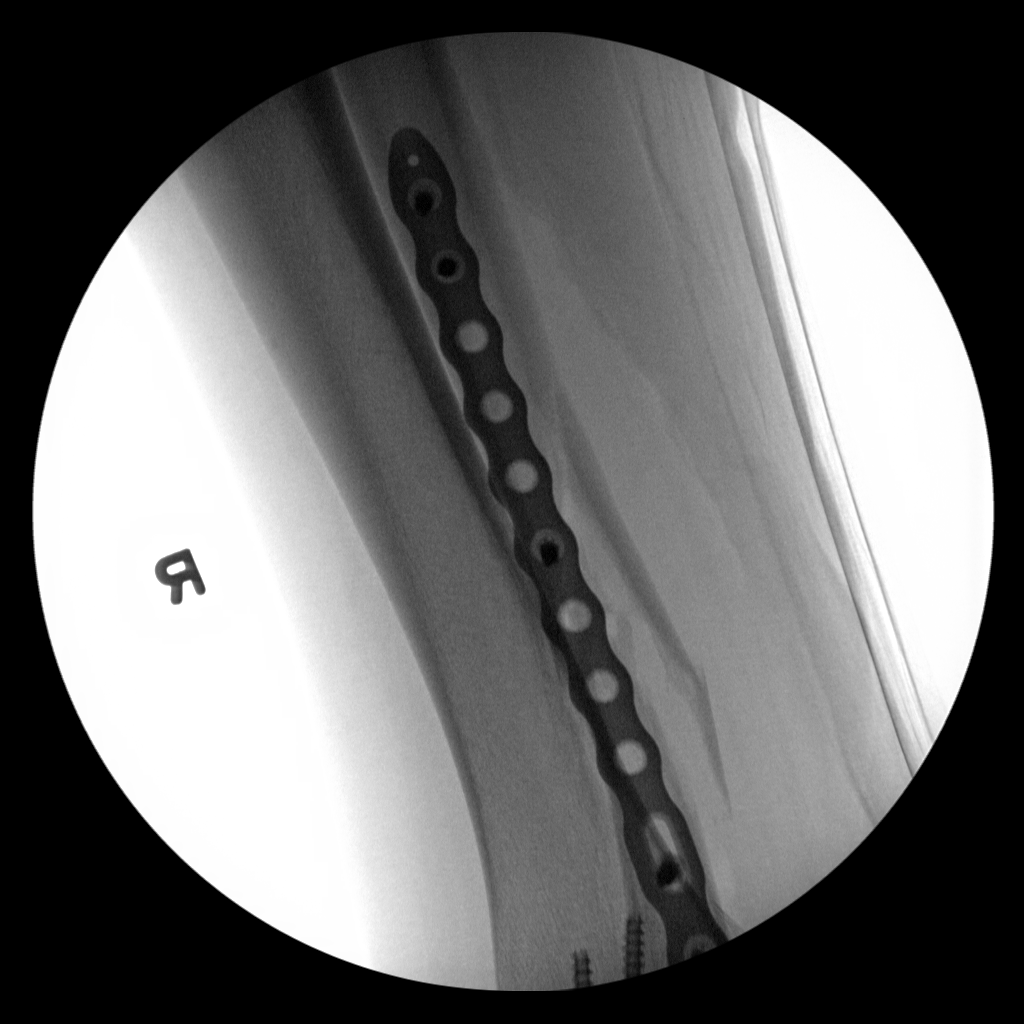

[5 of 5 positions shown; findings below may reference images not displayed]

FINDINGS: Fractures of the distal fibula have been reduced with a lateral
fixation plate and associated screws. Two screws fixate the medial
malleolar fracture. The reduced fractures are in anatomic alignment.

There is no new fracture or evidence of an operative complication.
IMPRESSION: Well-aligned fractures of the right ankle following ORIF.
